# Patient Record
Sex: Male | Born: 1970 | Race: White | Hispanic: No | Marital: Married | Smoking: Former smoker
Health system: Southern US, Community
[De-identification: ages and names within clinical notes are randomized; demographics above are authoritative.]

## PROBLEM LIST (undated history)

## (undated) DIAGNOSIS — E785 Hyperlipidemia, unspecified: Secondary | ICD-10-CM

## (undated) DIAGNOSIS — F419 Anxiety disorder, unspecified: Secondary | ICD-10-CM

## (undated) HISTORY — DX: Anxiety disorder, unspecified: F41.9

## (undated) HISTORY — PX: OTHER SURGICAL HISTORY: SHX169

## (undated) HISTORY — DX: Hyperlipidemia, unspecified: E78.5

## (undated) HISTORY — PX: APPENDECTOMY: SHX54

---

## 2000-02-17 ENCOUNTER — Ambulatory Visit (HOSPITAL_COMMUNITY): Admission: RE | Admit: 2000-02-17 | Discharge: 2000-02-17 | Payer: Self-pay | Admitting: Urology

## 2003-11-12 ENCOUNTER — Encounter: Admission: RE | Admit: 2003-11-12 | Discharge: 2003-11-12 | Payer: Self-pay | Admitting: Family Medicine

## 2009-02-04 ENCOUNTER — Observation Stay (HOSPITAL_COMMUNITY): Admission: EM | Admit: 2009-02-04 | Discharge: 2009-02-05 | Payer: Self-pay | Admitting: Emergency Medicine

## 2009-02-05 ENCOUNTER — Encounter (INDEPENDENT_AMBULATORY_CARE_PROVIDER_SITE_OTHER): Payer: Self-pay | Admitting: Surgery

## 2009-08-27 ENCOUNTER — Observation Stay (HOSPITAL_COMMUNITY): Admission: EM | Admit: 2009-08-27 | Discharge: 2009-08-28 | Payer: Self-pay | Admitting: Emergency Medicine

## 2010-12-22 LAB — COMPREHENSIVE METABOLIC PANEL
ALT: 58 U/L — ABNORMAL HIGH (ref 0–53)
AST: 26 U/L (ref 0–37)
Albumin: 4.3 g/dL (ref 3.5–5.2)
Alkaline Phosphatase: 72 U/L (ref 39–117)
BUN: 13 mg/dL (ref 6–23)
CO2: 24 mEq/L (ref 19–32)
Calcium: 8.6 mg/dL (ref 8.4–10.5)
Chloride: 104 mEq/L (ref 96–112)
GFR calc Af Amer: >60 mL/min (ref >60)
GFR calc non Af Amer: >60 mL/min (ref >60)
Potassium: 3.6 mEq/L (ref 3.5–5.1)
Sodium: 142 mEq/L (ref 135–145)
Total Bilirubin: 0.9 mg/dL (ref 0.3–1.2)
Total Protein: 6.1 g/dL (ref 6.0–8.3)

## 2010-12-22 LAB — DIFFERENTIAL
Basophils Absolute: 0 10*3/uL (ref 0.0–0.1)
Basophils Relative: 0 % (ref 0–1)
Eosinophils Absolute: 0.1 10*3/uL (ref 0.0–0.7)
Eosinophils Relative: 0 % (ref 0–5)
Lymphs Abs: 1.7 10*3/uL (ref 0.7–4.0)
Monocytes Absolute: 0.7 10*3/uL (ref 0.1–1.0)
Monocytes Relative: 11 % (ref 3–12)
Neutro Abs: 11.6 10*3/uL — ABNORMAL HIGH (ref 1.7–7.7)
Neutrophils Relative %: 71 % (ref 43–77)

## 2010-12-22 LAB — BASIC METABOLIC PANEL
CO2: 26 mEq/L (ref 19–32)
Calcium: 8.9 mg/dL (ref 8.4–10.5)
Glucose, Bld: 115 mg/dL — ABNORMAL HIGH (ref 70–99)
Sodium: 140 mEq/L (ref 135–145)

## 2010-12-22 LAB — CBC
HCT: 39.2 % (ref 39.0–52.0)
HCT: 44.2 % (ref 39.0–52.0)
Hemoglobin: 13.9 g/dL (ref 13.0–17.0)
Hemoglobin: 15.7 g/dL (ref 13.0–17.0)
MCHC: 35.3 g/dL (ref 30.0–36.0)
MCHC: 35.5 g/dL (ref 30.0–36.0)
MCV: 88.5 fL (ref 78.0–100.0)
Platelets: 196 10*3/uL (ref 150–400)
Platelets: 226 10*3/uL (ref 150–400)
RBC: 5.02 MIL/uL (ref 4.22–5.81)
RDW: 12.4 % (ref 11.5–15.5)
RDW: 12.6 % (ref 11.5–15.5)
WBC: 13.3 10*3/uL — ABNORMAL HIGH (ref 4.0–10.5)

## 2010-12-22 LAB — URINALYSIS, ROUTINE W REFLEX MICROSCOPIC
Bilirubin Urine: NEGATIVE
Hgb urine dipstick: NEGATIVE
Protein, ur: NEGATIVE mg/dL
Urobilinogen, UA: 1 mg/dL (ref 0.0–1.0)

## 2010-12-22 LAB — HEPATITIS PANEL, ACUTE
HCV Ab: NEGATIVE
Hepatitis B Surface Ag: NEGATIVE

## 2010-12-22 LAB — URINE CULTURE: Culture: NO GROWTH

## 2010-12-22 LAB — LIPASE, BLOOD: Lipase: 16 U/L (ref 11–59)

## 2010-12-29 LAB — URINALYSIS, ROUTINE W REFLEX MICROSCOPIC
Hgb urine dipstick: NEGATIVE
Ketones, ur: NEGATIVE mg/dL
Protein, ur: NEGATIVE mg/dL
Urobilinogen, UA: 0.2 mg/dL (ref 0.0–1.0)

## 2010-12-29 LAB — HEPATIC FUNCTION PANEL
ALT: 33 U/L (ref 0–53)
Albumin: 4.1 g/dL (ref 3.5–5.2)
Alkaline Phosphatase: 73 U/L (ref 39–117)
Indirect Bilirubin: 0.7 mg/dL (ref 0.3–0.9)
Total Bilirubin: 0.8 mg/dL (ref 0.3–1.2)
Total Protein: 7.1 g/dL (ref 6.0–8.3)

## 2010-12-29 LAB — BASIC METABOLIC PANEL
BUN: 10 mg/dL (ref 6–23)
CO2: 24 mEq/L (ref 19–32)
Calcium: 9.4 mg/dL (ref 8.4–10.5)
Creatinine, Ser: 0.93 mg/dL (ref 0.4–1.5)
Glucose, Bld: 106 mg/dL — ABNORMAL HIGH (ref 70–99)

## 2010-12-29 LAB — CBC
MCHC: 34.8 g/dL (ref 30.0–36.0)
MCV: 87.2 fL (ref 78.0–100.0)
RDW: 12.6 % (ref 11.5–15.5)

## 2010-12-29 LAB — DIFFERENTIAL
Basophils Absolute: 0 10*3/uL (ref 0.0–0.1)
Basophils Relative: 1 % (ref 0–1)
Eosinophils Absolute: 0.1 10*3/uL (ref 0.0–0.7)
Neutro Abs: 3.5 10*3/uL (ref 1.7–7.7)
Neutrophils Relative %: 62 % (ref 43–77)

## 2010-12-29 LAB — LIPASE, BLOOD: Lipase: 19 U/L (ref 11–59)

## 2011-02-02 NOTE — H&P (Signed)
NAME:  Joshua Ochoa, Joshua Ochoa NO.:  [id number]   MEDICAL RECORD NO.:  [id number]          PATIENT TYPE:  INP   LOCATION:  3036                         FACILITY:  [facility]   PHYSICIAN:  Clovis Pu. Cornett, M.D.DATE OF BIRTH:  02-03-1971   DATE OF ADMISSION:  02/04/2009  DATE OF DISCHARGE:  02/05/2009                              HISTORY & PHYSICAL   CHIEF COMPLAINT:  Right lower quadrant pain.   HISTORY OF PRESENT ILLNESS:  The patient is a 40 year old male that I  was asked to see tonight due to a 36-hour history of abdominal pain.  The pain is located initially in his periumbilical region, now in his  right lower quadrant.  The pain is constant in nature, it is 5-6/10 and  slowly getting worse.  Pain is associated with temperature of 99.  No  nausea, vomiting, diarrhea, or blood in the stool noted at this point in  time.  The patient underwent a CT scan of his abdomen and pelvis, which  showed acute without appendicitis without perforation.  I was asked to  see him at the request of Dr. Gray Bernhardt in the emergency room for the  above.  The pain has been worse with palpation of his abdomen.  His pain  was better at rest.   PAST MEDICAL HISTORY:  Anxiety and hyperlipidemia.   PAST SURGICAL HISTORY:  None.   FAMILY HISTORY:  Noncontributory.   SOCIAL HISTORY:  Denies tobacco and drinks alcohol occasionally.   ALLERGIES:  SHELLFISH.   MEDICATIONS:  1. Zoloft 100 mg daily.  2. Wellbutrin 300 mg daily.  3. Simvastatin 40 mg daily.   REVIEW OF SYSTEMS:  Negative x15, except for that as above.   PHYSICAL EXAMINATION:  VITAL SIGNS:  Temperature 97, pulse 72, blood  pressure 102/55, and respiratory rate is 18.  GENERAL APPEARANCE:  Pleasant male in no apparent distress.  HEENT:  Extraocular movements are intact.  No evidence of scleral  icterus.  NECK:  Supple and nontender.  Trachea midline without mass.  PULMONARY:  Lungs sounds are clear bilaterally.  CHEST:  Wall  motion is normal.  CARDIOVASCULAR:  Regular rate and rhythm without rub, murmur, or gallop.  EXTREMITIES:  Warm and well perfused.  ABDOMEN:  Tender in right lower quadrant with mild rebound and guarding  noted.  No hernia.  No ascites.  EXTREMITIES:  Range of motion is normal.  Muscle tone is normal.  NEUROLOGIC:  Glasgow coma scale is 15.  Motor and sensory function are  intact.   DIAGNOSTIC STUDIES:  Abdomen and pelvic CT shows early acute  appendicitis without perforation.  He has a white count of 5,700 without  left shift, liver function studies are normal.  Lipase is normal.  Hemoglobin 15, platelet count 205,000.  Sodium 138, potassium 3.8,  chloride 104, CO2 24, BUN 10, creatinine 0.9, and glucose 106.   IMPRESSION:  1. Acute appendicitis.  2. History of anxiety.  3. Hyperlipidemia.   PLAN:  Recommended laparoscopic appendectomy with both potential for  conversation to an open appendectomy.  Risk of  bleeding, infection,  organ injury are all discussed.  Alternative therapies of antibiotics  were also discussed but the potential for surgery after antibiotic  therapy still present and I think he is an excellent operative  candidate.  He understands the above and agrees to proceed with  laparoscopic appendectomy.      Thomas A. Cornett, M.D.  Electronically Signed     TAC/MEDQ  D:  02/05/2009  T:  02/05/2009  Job:  [id number]

## 2011-02-02 NOTE — Op Note (Signed)
NAME:  Joshua Ochoa, Joshua Ochoa NO.:  [id number]   MEDICAL RECORD NO.:  [id number]          PATIENT TYPE:  INP   LOCATION:  3036                         FACILITY:  [facility]   PHYSICIAN:  Clovis Pu. Cornett, M.D.DATE OF BIRTH:  11-Jan-1971   DATE OF PROCEDURE:  02/05/2009  DATE OF DISCHARGE:                               OPERATIVE REPORT   PREOPERATIVE DIAGNOSIS:  Acute appendicitis.   POSTOPERATIVE DIAGNOSIS:  Acute appendicitis.   PROCEDURE:  Laparoscopic appendectomy.   SURGEON:  Maisie Fus A. Cornett, MD   ANESTHESIA:  General endotracheal anesthesia with 0.25% Sensorcaine  local.   ESTIMATED BLOOD LOSS:  30 mL.   SPECIMEN:  Appendix to Pathology.   DRAINS:  None.   INDICATIONS FOR PROCEDURE:  The patient is a 40 year old male with a 1-  day history of abdominal pain.  CT scan showed acute appendicitis and he  was brought to the operating room for laparoscopic appendectomy for  acute appendicitis.   DESCRIPTION OF PROCEDURE:  The patient was brought to the OR after  informed consent.  We discussed with him both laparoscopic appendectomy,  open appendectomy, and alternative therapy of antibiotics and the pros  and cons of each.  He wished to undergo laparoscopic appendectomy.  After informed consent was obtained, he was taken back to the operating  room.  He was placed supine.  After induction of general anesthesia, the  abdomen was prepped and draped in a sterile fashion.  Foley catheter was  placed.  A 1-cm supraumbilical incision was made.  Dissection was  carried down to his fascia and his fascia was opened in the midline.  I  used a small Kelly clamp to open the peritoneal lining and enter the  abdominal cavity without difficulty under direct vision.  Purse-string  suture of 0-Vicryl was placed and a 12-mm Hasson cannula was placed  under direct vision.  Pneumoperitoneum was created to 15 mmHg of CO2 and  a laparoscope was placed.  The patient was placed in  Trendelenburg and  rolled towards left.  I placed two 5-mm ports under direct vision.  One  in the midline just below the umbilicus, the second one in left lower  quadrant.  Cecum was identified, the appendix was long and tortuous, it  was somewhat retrocecal.  It was inflamed.  Harmonic scalpel was used to  dissect the appendix from the mesoappendix, good hemostasis.  Once I got  to the base of the appendix, I used a 5-mm scope and placed a GIA 45  stapler device across the base of the appendix at the level cecum and  fired it without difficulty.  We then put the appendix in an EndoCatch  bag and extracted it.  I then replaced a 10-mm scope, examined the  appendiceal stump.  There was some oozing that I controlled with  Surgicel.  The area where the appendix was dissected off the posterior  cecum was hemostatic, and there was no evidence of injury to the ileum  or cecum.  Remainder of the small bowel was normal without signs of  injury.  Stomach looked normal as well as the liver with some mild fatty  infiltration, but no cirrhosis.  Excess irrigation was suctioned out.  Hemostasis was examined and found to be adequate.  At this point in  time, we removed the ports allowed the CO2 to escape.  The umbilical  fascia was closed with purse-string suture of 0-Vicryl  and 4-0 Monocryl was used to close skin.  Dermabond was applied.  All  final counts of sponge, needle, and instruments were found to be correct  at this portion of case.  The patient was awoken, taken to recovery in  satisfactory condition.      Thomas A. Cornett, M.D.  Electronically Signed     TAC/MEDQ  D:  02/05/2009  T:  02/05/2009  Job:  [id number]

## 2011-02-05 NOTE — Op Note (Signed)
[facility]  Patient:    Joshua Ochoa, Joshua Ochoa                         MRN: [id number] Adm. Date:  [id number] Attending:  Thermon Leyland CC:         [name], M.D.                           Operative Report  PREOPERATIVE DIAGNOSES: 1. Large left varicocele. 2. Infertility.  POSTOPERATIVE DIAGNOSES: 1. Large left varicocele. 2. Infertility.  PROCEDURES PERFORMED:  Left varicocele ligation.  SURGEON:  Barron Alvine, M.D.  ASSISTANT:  ANESTHESIA:  General.  INDICATIONS:  Mr. Joshua Ochoa is a 40 year old male.  He and his wife have been experiencing infertility for approximately one year now.  He underwent evaluation in our office, which revealed abnormal semen analysis.  He had borderline sperm concentration of 18,000,000 sperm with a total count of approximately 47,000,000.  Motility was reasonably normal, but morphology was significantly abnormal.  A repeat semen analysis showed a significantly lower count.  On clinical exam, the patient had a very large left varicocele.  We discussed at length some of the issues with regard to varicocele and infertility.  We discussed the option of varicocele ligation.  We talked about the potential risks and benefits of this approach and he elected to proceed with this.  DESCRIPTION OF PROCEDURE:  The patient was brought to the operating room where he had successful induction of general anesthesia.  He was prepped and draped in normal manner in the supine position.  Magnifying operative loops were utilized.  A standard left inguinal incision was performed and carried down to the level of the external oblique fascia.  The external ring was identified and the external oblique fascia was opened in the direction of its fibers to expose the underlying spermatic cord.  One could appreciate markedly enlarged spermatic cord veins.  At the level of the pubic tubercle, the entire spermatic cord was encircled with a  Penrose drain.  Coming out of the floor of the inguinal canal was a very large cremasteric vein.  This was approximately 8 mm in diameter.  This vein was ligated with 2-0 Vicryl suture and clipped as well.  Attention was then turned towards dissection of the spermatic cord.  In addition to the operative loops, an intraoperative Doppler was also utilized. We encountered three very large spermatic cord veins.  These measured between 5-8 mm in size.  These were all suture ligated and clipped.  We identified the spermatic artery, which was preserved and also the vasal artery.  I made no attempt to dissect some very small veins around the vas.  At the completion of the procedure, we had ligated all significant veins.  There were some very small veins around the vas, but we left those intact with the adventitia all along the vas.  Once again, utilizing a Doppler, we confirmed the presence of a spermatic cord artery.  There was really no other significant tissue within the spermatic cord at that juncture.  The spermatic cord was placed back in position.  A Marcaine block was utilized.  The external oblique fascia was fairly attenuated.  This was reapproximated with 2-0 Vicryl suture.  I did not appreciate any obvious hernia through the floor.  The ilioinguinal nerve was identified and preserved.  The wound was irrigated.  The subcutaneous tissues were reapproximated  with 3-0 Vicryl and a 4-0 Vicryl was used for subcuticular.  The patient appeared to tolerate the procedure well and there were no obvious complications. DD:  02/17/00 TD:  02/18/00 Job: [id number]

## 2011-12-30 ENCOUNTER — Ambulatory Visit
Admission: RE | Admit: 2011-12-30 | Discharge: 2011-12-30 | Disposition: A | Discharge status: Home / Self Care | Payer: 59 | Source: Ambulatory Visit | Attending: Internal Medicine | Admitting: Internal Medicine

## 2011-12-30 ENCOUNTER — Other Ambulatory Visit: Payer: Self-pay | Admitting: Internal Medicine

## 2011-12-30 DIAGNOSIS — R410 Disorientation, unspecified: Secondary | ICD-10-CM

## 2011-12-30 DIAGNOSIS — R51 Headache: Secondary | ICD-10-CM

## 2011-12-30 DIAGNOSIS — R42 Dizziness and giddiness: Secondary | ICD-10-CM

## 2012-06-09 IMAGING — CT CT HEAD W/O CM
2 series · 16 of 30 positions shown, 18 images · non-contrast
Comparison: None.

CLINICAL DATA: Left frontotemporal headache for 1 month, confusion
with some dizziness

CT HEAD WITHOUT CONTRAST
TECHNIQUE: Contiguous axial images were obtained from the base of
the skull through the vertex without contrast.

[Series 2: head w/o · axial · non-contrast · 0.45mm/px · z∈[+36,+146]mm · 8 of 28 slices shown, 10 images]
[im 4/28  brain]
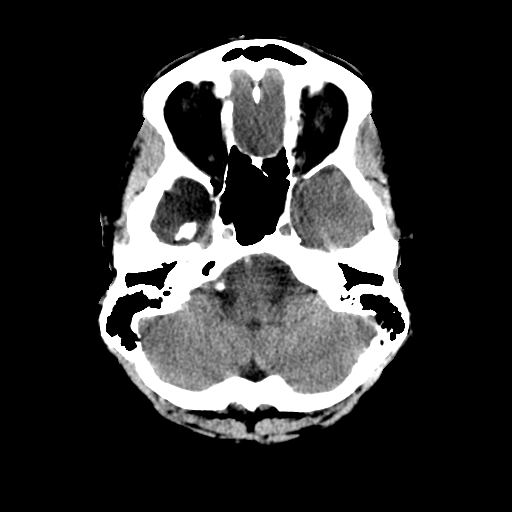
[im 4/28  bone]
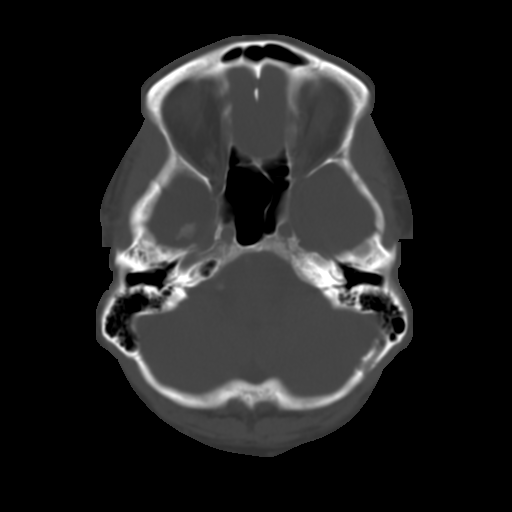
[im 7/28  brain]
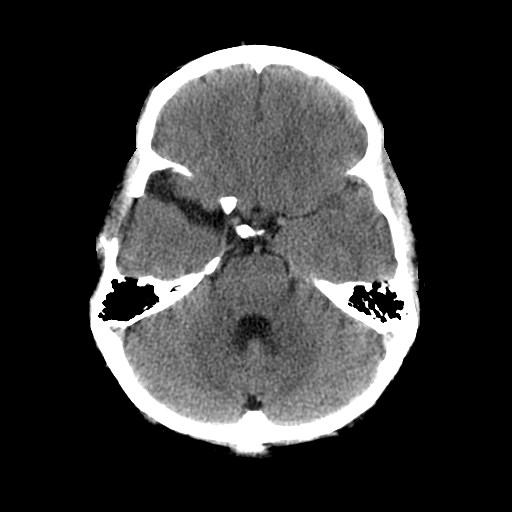
[im 10/28  brain]
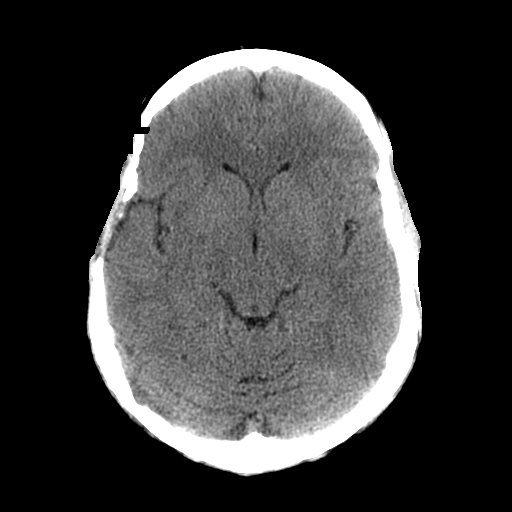
[im 13/28  brain]
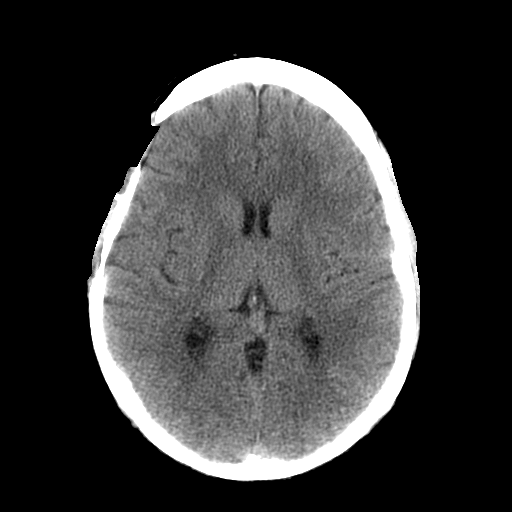
[im 16/28  brain]
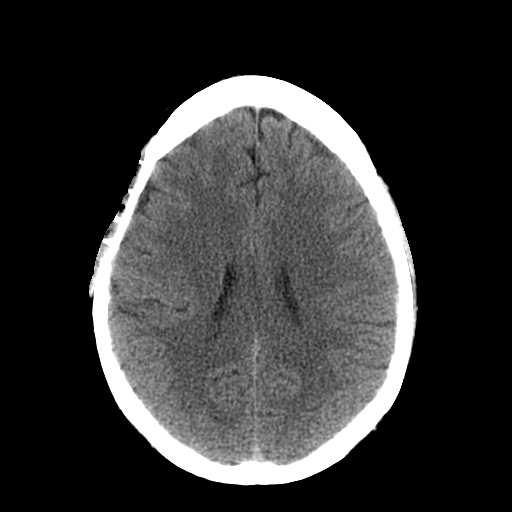
[im 16/28  bone]
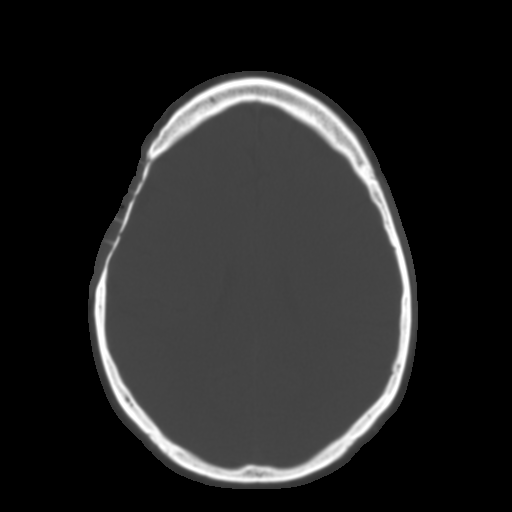
[im 19/28  brain]
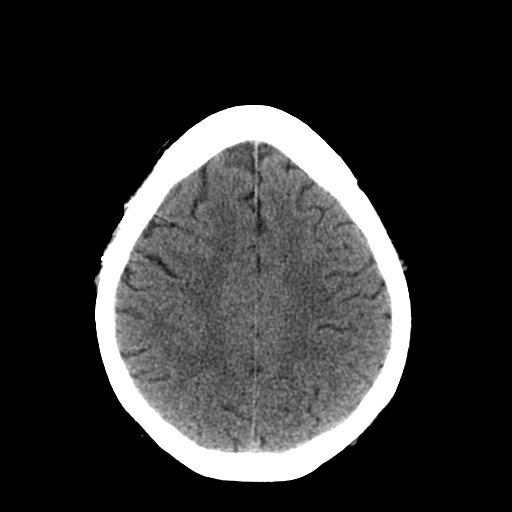
[im 22/28  brain]
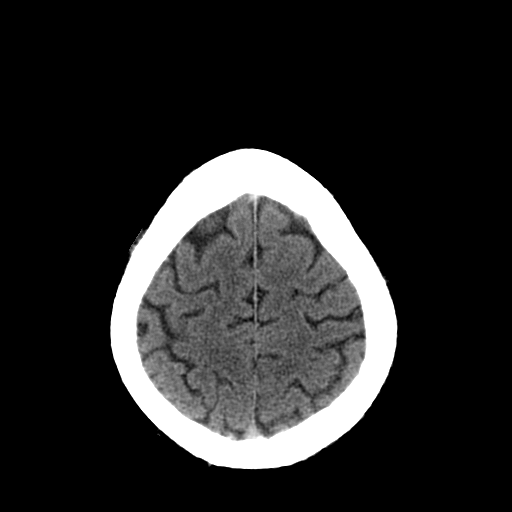
[im 25/28  brain]
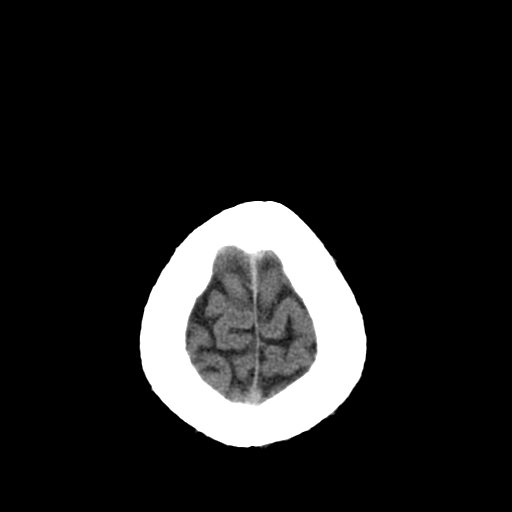

[Series 3: head bone · axial · 0.45mm/px · z∈[+32,+148]mm · 8 of 56 slices shown]
[im 6/56  bone]
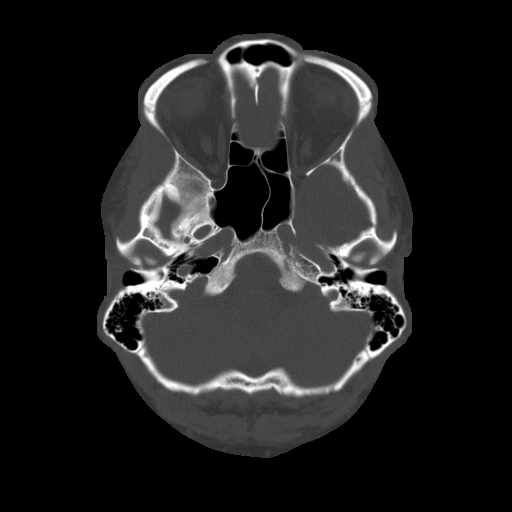
[im 12/56  bone]
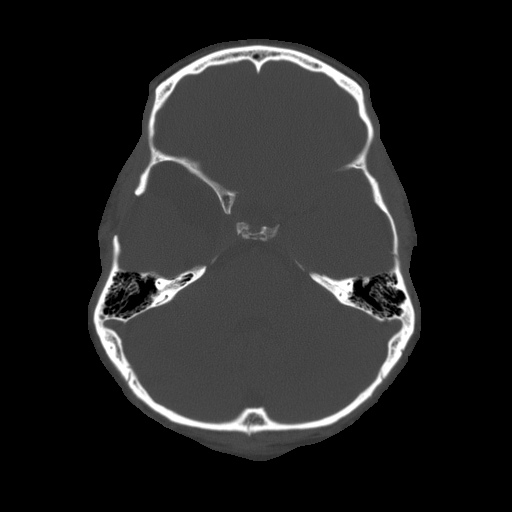
[im 18/56  bone]
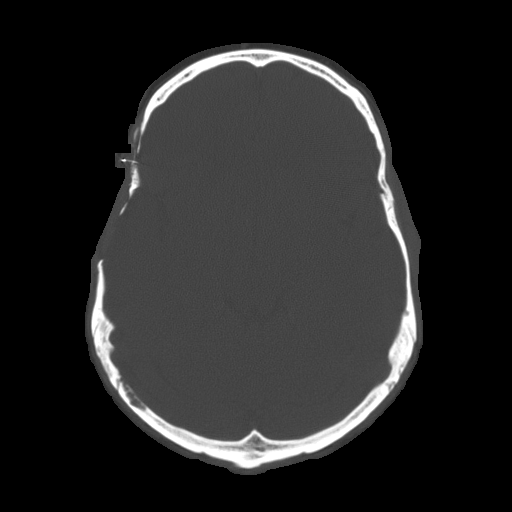
[im 24/56  bone]
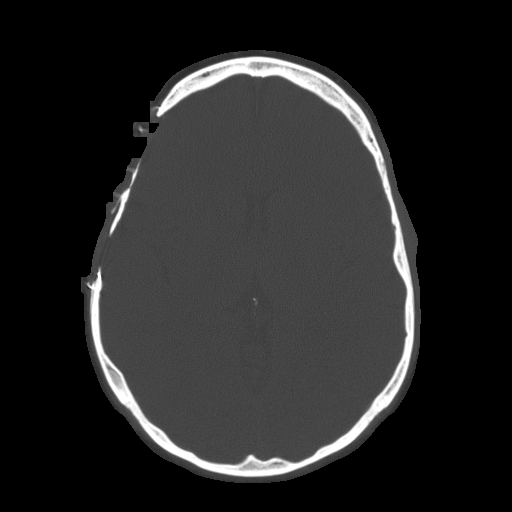
[im 32/56  bone]
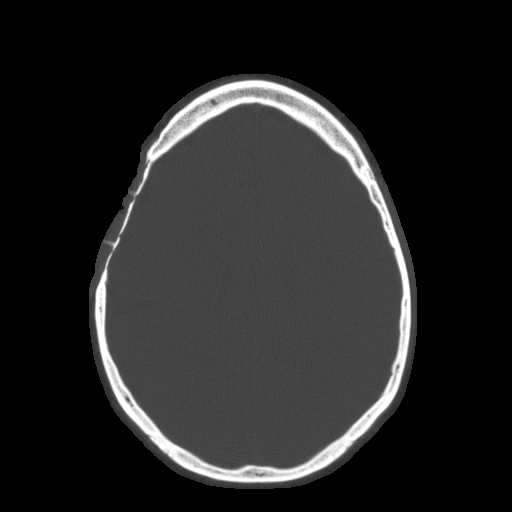
[im 38/56  bone]
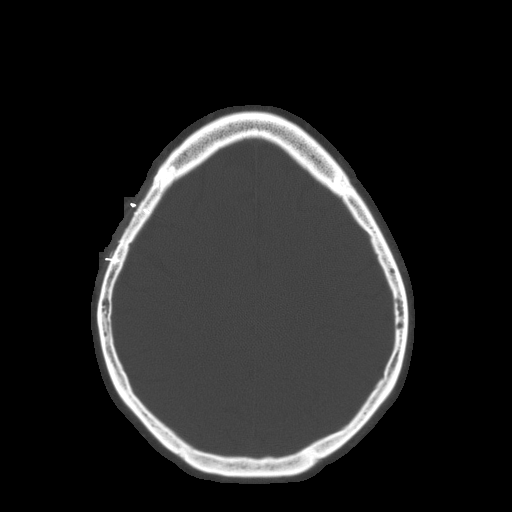
[im 44/56  bone]
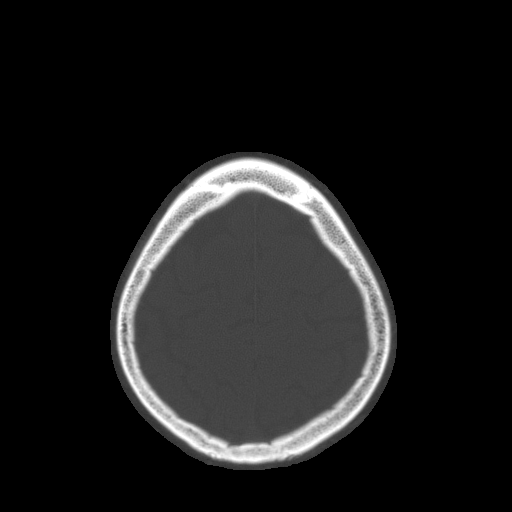
[im 50/56  bone]
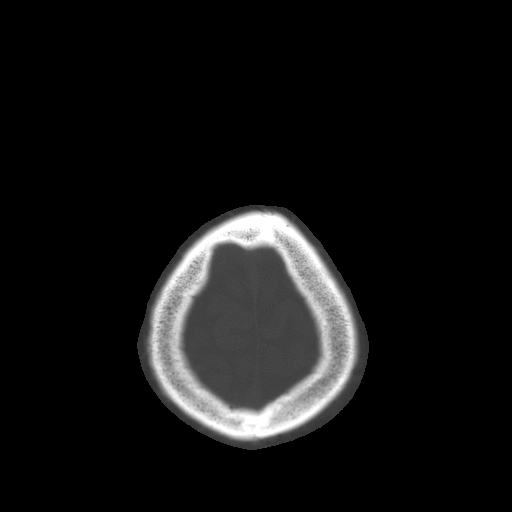

[16 of 30 positions shown; findings below may reference images not displayed]

FINDINGS: Encephalomalacia is noted in the right temporal region
with changes of prior right temporal parietal cranioplasty noted.
There is some air in the soft tissues over the right temporal
parietal cranioplasty site.  This probably is air trapped within
the cranioplasty itself and is of doubtful significance.  No
hemorrhage, mass lesion, or acute infarction is seen.  The
ventricular system is normal in size and the septum is midline in
position.  No acute calvarial abnormality is seen.
IMPRESSION: Right temporal encephalomalacia.  No acute intracranial
abnormality.

## 2014-11-07 ENCOUNTER — Ambulatory Visit (INDEPENDENT_AMBULATORY_CARE_PROVIDER_SITE_OTHER): Payer: 59

## 2014-11-07 ENCOUNTER — Ambulatory Visit: Payer: Self-pay | Admitting: Podiatry

## 2014-11-07 ENCOUNTER — Ambulatory Visit (INDEPENDENT_AMBULATORY_CARE_PROVIDER_SITE_OTHER): Payer: 59 | Admitting: Podiatrist

## 2014-11-07 ENCOUNTER — Encounter: Payer: Self-pay | Admitting: Podiatrist

## 2014-11-07 VITALS — BP 123/72 | HR 89 | Resp 10 | Ht 73.0 in | Wt 218.0 lb

## 2014-11-07 DIAGNOSIS — M722 Plantar fascial fibromatosis: Secondary | ICD-10-CM

## 2014-11-07 NOTE — Progress Notes (Signed)
   Subjective:    Patient ID: Joshua Ochoa, male    DOB: [birth year], 44 y.o.   MRN: [id number]  HPI Chief Complaint  Patient presents with  . Plantar Fasciitis    "I've got pain in the front part of my right heel."  right inferior arch Pt complains of right inferior arch pain for 1 month, worsening 1 week.  Pt is currently wearing elastic arch bands with arch supports on both feet.       Review of Systems  All other systems reviewed and are negative.      Objective:   Physical Exam  GENERAL APPEARANCE: Alert, conversant. Appropriately groomed. No acute distress.  VASCULAR: Pedal pulses palpable and strong bilateral.  Capillary refill time is immediate to all digits,  Proximal to distal cooling it warm to warm.  Digital hair growth is present bilateral  NEUROLOGIC: sensation is intact epicritically and protectively to 5.07 monofilament at 5/5 sites bilateral.  Light touch is intact bilateral, vibratory sensation intact bilateral, achilles tendon reflex is intact bilateral.  Negative Tinel sign is elicited MUSCULOSKELETAL: Pain on palpation plantar medial aspect right foot  at insertion of plantar fascia on the medial calcaneal tubercle. Inflammation at the insertion of the plantar fascia is present. Rectus foot type is seen. DERMATOLOGIC: skin color, texture, and turger are within normal limits.  No preulcerative lesions are seen, no interdigital maceration noted.  No open lesions present.  Digital nails are asymptomatic.      Assessment & Plan:  Plantar fasciitis right  Plan:  Injected the heel with kenalog and marcaine plain without complication.  He was scanned for orthotics and given stretching exercises.  Will see him back for dispensing of orthotics.

## 2014-11-07 NOTE — Patient Instructions (Signed)

## 2014-11-29 ENCOUNTER — Ambulatory Visit: Payer: 59 | Admitting: *Deleted

## 2014-11-29 DIAGNOSIS — M722 Plantar fascial fibromatosis: Secondary | ICD-10-CM

## 2014-11-29 NOTE — Progress Notes (Signed)
Patient ID: Joshua Ochoa, male   DOB: 1970/10/21, 44 y.o.   MRN: [id number] PICKING UP INSERTS

## 2014-11-29 NOTE — Patient Instructions (Signed)

## 2016-11-15 ENCOUNTER — Other Ambulatory Visit (HOSPITAL_BASED_OUTPATIENT_CLINIC_OR_DEPARTMENT_OTHER): Payer: Self-pay

## 2016-11-15 DIAGNOSIS — G4730 Sleep apnea, unspecified: Secondary | ICD-10-CM

## 2016-11-15 DIAGNOSIS — R0683 Snoring: Secondary | ICD-10-CM

## 2016-12-01 ENCOUNTER — Ambulatory Visit (HOSPITAL_BASED_OUTPATIENT_CLINIC_OR_DEPARTMENT_OTHER): Payer: BLUE CROSS/BLUE SHIELD | Attending: Otolaryngology | Admitting: Internal Medicine

## 2016-12-01 DIAGNOSIS — G4736 Sleep related hypoventilation in conditions classified elsewhere: Secondary | ICD-10-CM | POA: Diagnosis not present

## 2016-12-01 DIAGNOSIS — G4733 Obstructive sleep apnea (adult) (pediatric): Secondary | ICD-10-CM | POA: Diagnosis not present

## 2016-12-08 ENCOUNTER — Other Ambulatory Visit (HOSPITAL_BASED_OUTPATIENT_CLINIC_OR_DEPARTMENT_OTHER): Payer: Self-pay

## 2016-12-08 DIAGNOSIS — R0683 Snoring: Secondary | ICD-10-CM

## 2016-12-08 DIAGNOSIS — G4730 Sleep apnea, unspecified: Secondary | ICD-10-CM

## 2016-12-11 DIAGNOSIS — G4733 Obstructive sleep apnea (adult) (pediatric): Secondary | ICD-10-CM | POA: Diagnosis not present

## 2016-12-11 NOTE — Procedures (Signed)
   Patient Name: Joshua Ochoa, Joshua Ochoa Date: 12/02/2016 Gender: Male D.O.B: [birth year] Age (years): 45 Referring Provider: Jodi Marble Height (inches): 63 Interpreting Physician: Baird Lyons MD, ABSM Weight (lbs): 205 RPSGT: Jacolyn Reedy BMI: 29 MRN: [id number] Neck Size: 16.50 CLINICAL INFORMATION Sleep Study Type: unattended HST     Indication for sleep study: Snoring     Epworth Sleepiness Score: 14  SLEEP STUDY TECHNIQUE A multi-channel overnight portable sleep study was performed. The channels recorded were: nasal airflow, thoracic respiratory movement, and oxygen saturation with a pulse oximetry. Snoring was also monitored.  MEDICATIONS Patient self administered medications include: none reported.  SLEEP ARCHITECTURE Patient was studied for 342.0 minutes. The sleep efficiency was 99.1 % and the patient was supine for 94%. The arousal index was 0.0 per hour.  RESPIRATORY PARAMETERS The overall AHI was 67.0 per hour, with a central apnea index of 0.5 per hour.  The oxygen nadir was 70% during sleep.  CARDIAC DATA Mean heart rate during sleep was 68.4 bpm.  IMPRESSIONS - Severe obstructive sleep apnea occurred during this study (AHI = 67.0/h). - No significant central sleep apnea occurred during this study (CAI = 0.5/h). - Severe oxygen desaturation was noted during this study (Min O2 = 70%). - Patient snored   DIAGNOSIS - Obstructive Sleep Apnea (327.23 [G47.33 ICD-10]) - Nocturnal Hypoxemia (327.26 [G47.36 ICD-10])  RECOMMENDATIONS - Recommend CPAP titration. Clinical judgment may indicate alternatives. - Positional therapy avoiding supine position during sleep. - Avoid alcohol, sedatives and other CNS depressants that may worsen sleep apnea and disrupt normal sleep architecture. - Sleep hygiene should be reviewed to assess factors that may improve sleep quality. - Weight management and regular exercise should be initiated or  continued.  [Electronically signed] 12/11/2016 10:45 AM  Baird Lyons MD, ABSM Diplomate, American Board of Sleep Medicine   NPI: [id number] [place], American Board of Sleep Medicine  ELECTRONICALLY SIGNED ON:  12/11/2016, 10:43 AM [place] PH: [phone]   FX: [phone] [place]

## 2017-03-23 DIAGNOSIS — G4733 Obstructive sleep apnea (adult) (pediatric): Secondary | ICD-10-CM | POA: Diagnosis not present

## 2017-03-30 DIAGNOSIS — Z86010 Personal history of colonic polyps: Secondary | ICD-10-CM | POA: Diagnosis not present

## 2017-03-30 DIAGNOSIS — D126 Benign neoplasm of colon, unspecified: Secondary | ICD-10-CM | POA: Diagnosis not present

## 2017-03-30 DIAGNOSIS — Z800 Family history of malignant neoplasm of digestive organs: Secondary | ICD-10-CM | POA: Diagnosis not present

## 2017-03-31 DIAGNOSIS — D126 Benign neoplasm of colon, unspecified: Secondary | ICD-10-CM | POA: Diagnosis not present

## 2017-04-23 DIAGNOSIS — G4733 Obstructive sleep apnea (adult) (pediatric): Secondary | ICD-10-CM | POA: Diagnosis not present

## 2017-04-29 DIAGNOSIS — G4733 Obstructive sleep apnea (adult) (pediatric): Secondary | ICD-10-CM | POA: Diagnosis not present

## 2017-05-05 DIAGNOSIS — R350 Frequency of micturition: Secondary | ICD-10-CM | POA: Diagnosis not present

## 2017-05-05 DIAGNOSIS — N3020 Other chronic cystitis without hematuria: Secondary | ICD-10-CM | POA: Diagnosis not present

## 2017-05-24 DIAGNOSIS — G4733 Obstructive sleep apnea (adult) (pediatric): Secondary | ICD-10-CM | POA: Diagnosis not present

## 2017-06-03 DIAGNOSIS — N3010 Interstitial cystitis (chronic) without hematuria: Secondary | ICD-10-CM | POA: Diagnosis not present

## 2017-06-23 DIAGNOSIS — N411 Chronic prostatitis: Secondary | ICD-10-CM | POA: Diagnosis not present

## 2017-06-23 DIAGNOSIS — G4733 Obstructive sleep apnea (adult) (pediatric): Secondary | ICD-10-CM | POA: Diagnosis not present

## 2017-06-23 DIAGNOSIS — N3020 Other chronic cystitis without hematuria: Secondary | ICD-10-CM | POA: Diagnosis not present

## 2017-07-24 DIAGNOSIS — G4733 Obstructive sleep apnea (adult) (pediatric): Secondary | ICD-10-CM | POA: Diagnosis not present

## 2017-08-05 DIAGNOSIS — G4733 Obstructive sleep apnea (adult) (pediatric): Secondary | ICD-10-CM | POA: Diagnosis not present

## 2017-08-23 DIAGNOSIS — G4733 Obstructive sleep apnea (adult) (pediatric): Secondary | ICD-10-CM | POA: Diagnosis not present

## 2017-08-31 DIAGNOSIS — E782 Mixed hyperlipidemia: Secondary | ICD-10-CM | POA: Diagnosis not present

## 2017-08-31 DIAGNOSIS — R7309 Other abnormal glucose: Secondary | ICD-10-CM | POA: Diagnosis not present

## 2017-08-31 DIAGNOSIS — Z0000 Encounter for general adult medical examination without abnormal findings: Secondary | ICD-10-CM | POA: Diagnosis not present

## 2017-08-31 DIAGNOSIS — Z131 Encounter for screening for diabetes mellitus: Secondary | ICD-10-CM | POA: Diagnosis not present

## 2017-09-06 DIAGNOSIS — N401 Enlarged prostate with lower urinary tract symptoms: Secondary | ICD-10-CM | POA: Diagnosis not present

## 2017-09-06 DIAGNOSIS — E782 Mixed hyperlipidemia: Secondary | ICD-10-CM | POA: Diagnosis not present

## 2017-09-06 DIAGNOSIS — Z0000 Encounter for general adult medical examination without abnormal findings: Secondary | ICD-10-CM | POA: Diagnosis not present

## 2017-09-23 DIAGNOSIS — G4733 Obstructive sleep apnea (adult) (pediatric): Secondary | ICD-10-CM | POA: Diagnosis not present

## 2017-10-24 DIAGNOSIS — G4733 Obstructive sleep apnea (adult) (pediatric): Secondary | ICD-10-CM | POA: Diagnosis not present

## 2017-11-21 DIAGNOSIS — G4733 Obstructive sleep apnea (adult) (pediatric): Secondary | ICD-10-CM | POA: Diagnosis not present

## 2017-12-24 ENCOUNTER — Encounter (HOSPITAL_COMMUNITY): Payer: Self-pay | Admitting: Emergency Medicine

## 2017-12-24 ENCOUNTER — Emergency Department (HOSPITAL_COMMUNITY)
Admission: EM | Admit: 2017-12-24 | Discharge: 2017-12-24 | Disposition: A | Discharge status: Home / Self Care | Payer: 59 | Attending: Emergency Medicine | Admitting: Emergency Medicine

## 2017-12-24 ENCOUNTER — Other Ambulatory Visit: Payer: Self-pay

## 2017-12-24 ENCOUNTER — Emergency Department (HOSPITAL_COMMUNITY): Payer: 59

## 2017-12-24 DIAGNOSIS — F419 Anxiety disorder, unspecified: Secondary | ICD-10-CM | POA: Diagnosis not present

## 2017-12-24 DIAGNOSIS — S61411A Laceration without foreign body of right hand, initial encounter: Secondary | ICD-10-CM | POA: Diagnosis not present

## 2017-12-24 DIAGNOSIS — Z23 Encounter for immunization: Secondary | ICD-10-CM | POA: Insufficient documentation

## 2017-12-24 DIAGNOSIS — Y998 Other external cause status: Secondary | ICD-10-CM | POA: Diagnosis not present

## 2017-12-24 DIAGNOSIS — Z87891 Personal history of nicotine dependence: Secondary | ICD-10-CM | POA: Diagnosis not present

## 2017-12-24 DIAGNOSIS — Y9389 Activity, other specified: Secondary | ICD-10-CM | POA: Diagnosis not present

## 2017-12-24 DIAGNOSIS — Y929 Unspecified place or not applicable: Secondary | ICD-10-CM | POA: Insufficient documentation

## 2017-12-24 DIAGNOSIS — W270XXA Contact with workbench tool, initial encounter: Secondary | ICD-10-CM | POA: Diagnosis not present

## 2017-12-24 DIAGNOSIS — Z79899 Other long term (current) drug therapy: Secondary | ICD-10-CM | POA: Diagnosis not present

## 2017-12-24 MED ORDER — LIDOCAINE-EPINEPHRINE (PF) 2 %-1:200000 IJ SOLN
10.0000 mL | Freq: Once | INTRAMUSCULAR | Status: AC
  Administered 2017-12-24: 10 mL
  Filled 2017-12-24: qty 20

## 2017-12-24 MED ORDER — TETANUS-DIPHTH-ACELL PERTUSSIS 5-2.5-18.5 LF-MCG/0.5 IM SUSP
0.5000 mL | Freq: Once | INTRAMUSCULAR | Status: AC
  Administered 2017-12-24: 0.5 mL via INTRAMUSCULAR
  Filled 2017-12-24: qty 0.5

## 2017-12-24 NOTE — ED Triage Notes (Signed)
Pt stated that he struck the top of his hand on the teeth of a chop saw. 5 inch open  laceration noted on posterior side of r/hand. Bleeding controlled. Pt denies LOC

## 2017-12-24 NOTE — Discharge Instructions (Signed)
Keep wound clean with mild soap and water. Keep area covered with a topical antibiotic ointment and bandage, keep bandage dry, and do not submerge in water for 24 hours. Keep splint on while the stitches are in place, in order to avoid tearing the wound open. Ice and elevate for additional pain and swelling relief. Alternate between Ibuprofen and Tylenol for additional pain relief. Follow up with your primary care doctor or the [facility] [place] in approximately 2-3 days for wound recheck and again in 7-10 days for wound recheck and suture removal. Monitor area for signs of infection to include, but not limited to: increasing pain, spreading redness, drainage/pus, worsening swelling, or fevers. Return to emergency department for emergent changing or worsening symptoms.

## 2017-12-24 NOTE — ED Provider Notes (Signed)
[facility] DEPT Provider Note   CSN: [id number] Arrival date & time: 12/24/17  1433     History   Chief Complaint Chief Complaint  Patient presents with  . Extremity Laceration    HPI Joshua Ochoa is a 47 y.o. L-handed male with a PMHx of anxiety, OSA on CPAP, and HLD, who presents to the ED with complaints of right hand laceration sustained at 2 PM, approximately 1 hour prior to arrival.  Patient was using a chop saw and brought his hand up and accidentally cut it on the blade of the saw.  Bleeding was quickly controlled with pressure and butterfly strips.  He washed the wound and applied the butterfly strips prior to arrival.  No known aggravating factors.  He denies any pain, swelling, bruising, numbness, tingling, focal weakness, loss of ROM of the fingers, or any other complaints or injuries at this time.  He is unsure of his last tetanus shot.  He is not on any blood thinners.  The history is provided by the patient and medical records. No language interpreter was used.  Laceration   The incident occurred 1 to 2 hours ago. The laceration is located on the right hand. The laceration is 7 cm in size. The laceration mechanism was a a metal edge. The pain is at a severity of 0/10. The patient is experiencing no pain. He reports no foreign bodies present. His tetanus status is unknown.    Past Medical History:  Diagnosis Date  . Anxiety   . Hyperlipidemia     There are no active problems to display for this patient.   Past Surgical History:  Procedure Laterality Date  . APPENDECTOMY    . head surgery          Home Medications    Prior to Admission medications   Medication Sig Start Date End Date Taking? Authorizing Provider  atorvastatin (LIPITOR) 40 MG tablet Take 40 mg by mouth daily.    [provider]  BuPROPion HCl (WELLBUTRIN PO) Take 450 mg by mouth.    [provider]  Cholecalciferol (VITAMIN D-3 PO) Take by  mouth.    [provider]  escitalopram (LEXAPRO) 20 MG tablet Take 20 mg by mouth daily.    [provider]    Family History Family History  Problem Relation Age of Onset  . Cancer Mother   . Heart failure Father     Social History Social History   Tobacco Use  . Smoking status: Former Smoker  Substance Use Topics  . Alcohol use: Yes    Alcohol/week: 0.0 oz    Comment: weekly  . Drug use: Never     Allergies   Shellfish allergy   Review of Systems Review of Systems  Musculoskeletal: Negative for arthralgias, joint swelling and myalgias.  Skin: Positive for wound. Negative for color change.  Allergic/Immunologic: Negative for immunocompromised state.  Neurological: Negative for weakness and numbness.  Hematological: Does not bruise/bleed easily.  Psychiatric/Behavioral: Negative for confusion.     Physical Exam Updated Vital Signs BP (!) 147/92 (BP Location: Left Arm)   Pulse 90   Temp 98.4 F (36.9 C) (Oral)   Resp 18   Wt 99.8 kg (220 lb)   SpO2 98%   BMI 29.03 kg/m    Physical Exam  Constitutional: He is oriented to person, place, and time. Vital signs are normal. He appears well-developed and well-nourished.  Non-toxic appearance. No distress.  Afebrile, nontoxic, NAD  HENT:  Head: Normocephalic and atraumatic.  Mouth/Throat: Mucous membranes are normal.  Eyes: Conjunctivae and EOM are normal. Right eye exhibits no discharge. Left eye exhibits no discharge.  Neck: Normal range of motion. Neck supple.  Cardiovascular: Normal rate and intact distal pulses.  Pulmonary/Chest: Effort normal. No respiratory distress.  Abdominal: Normal appearance. He exhibits no distension.  Musculoskeletal: Normal range of motion.       Right hand: He exhibits tenderness and laceration. He exhibits normal range of motion, normal two-point discrimination, normal capillary refill, no deformity and no swelling. Normal sensation noted. Normal strength  noted.  R hand with FROM intact in all joints of the fingers and hand, with an ~7cm fairly linear laceration to the dorsum of the R hand, crossing over the 3rd MCP joint, doesn't appear to disrupt the fascia or tendons, mild tenderness around the wound along the 2nd and 3rd MCP joint areas but otherwise no tenderness elsewhere. Bleeding controlled. No obvious retained FBs. No crepitus or deformity, no significant swelling. Strength and sensation grossly intact, distal pulses intact, cap refill brisk and present in all digits, and compartments soft. SEE PICTURE BELOW  Neurological: He is alert and oriented to person, place, and time. He has normal strength. No sensory deficit.  Skin: Skin is warm and dry. Laceration noted. No rash noted.  R hand lac as mentioned above and pictured below  Psychiatric: He has a normal mood and affect.  Nursing note and vitals reviewed.      ED Treatments / Results  Labs (all labs ordered are listed, but only abnormal results are displayed) Labs Reviewed - No data to display  EKG None  Radiology Dg Hand 2 View Right  Result Date: 12/24/2017 CLINICAL DATA:  Laceration. EXAM: RIGHT HAND - 2 VIEW COMPARISON:  None. FINDINGS: There is no evidence of fracture or dislocation. There is no evidence of arthropathy or other focal bone abnormality. No evidence for radiopaque soft tissue foreign body. IMPRESSION: Negative. Electronically Signed   By: Misty Stanley M.D.   On: 12/24/2017 16:20    Procedures .Marland KitchenLaceration Repair Date/Time: 12/24/2017 4:15 PM Performed by: Reece Agar, PA-C Authorized by: Reece Agar, [place]   Consent:    Consent obtained:  Verbal   Consent given by:  Patient   Risks discussed:  Infection, poor cosmetic result and poor wound healing   Alternatives discussed:  Referral Anesthesia (see MAR for exact dosages):    Anesthesia method:  Local infiltration   Local anesthetic:  Lidocaine 2% WITH epi Laceration details:    Location:   Hand   Hand location:  R hand, dorsum   Length (cm):  7   Depth (mm):  5 Repair type:    Repair type:  Simple Pre-procedure details:    Preparation:  Patient was prepped and draped in usual sterile fashion and imaging obtained to evaluate for foreign bodies Exploration:    Hemostasis achieved with:  Direct pressure   Wound exploration: wound explored through full range of motion and entire depth of wound probed and visualized     Wound extent: no fascia violation noted, no foreign bodies/material noted and no tendon damage noted     Contaminated: no   Treatment:    Area cleansed with:  Saline   Amount of cleaning:  Extensive   Irrigation solution:  Sterile saline   Irrigation method:  Syringe Skin repair:    Repair method:  Sutures   Suture size:  4-0   Suture material:  Prolene  Suture technique:  Simple interrupted   Number of sutures:  6 Approximation:    Approximation:  Close Post-procedure details:    Dressing:  Antibiotic ointment, non-adherent dressing and splint for protection   Patient tolerance of procedure:  Tolerated well, no immediate complications   (including critical care time)  Medications Ordered in ED Medications  lidocaine-EPINEPHrine (XYLOCAINE W/EPI) 2 %-1:200000 (PF) injection 10 mL (10 mLs Infiltration Given by Other 12/24/17 1635)  Tdap (BOOSTRIX) injection 0.5 mL (0.5 mLs Intramuscular Given 12/24/17 1634)     Initial Impression / Assessment and Plan / ED Course  I have reviewed the triage vital signs and the nursing notes.  Pertinent labs & imaging results that were available during my care of the patient were reviewed by me and considered in my medical decision making (see chart for details).     47 y.o. male here with R hand lac sustained ~1hr ago, he was using a chop saw and brought his hand up and accidentally cut it on the saw blade. On exam, ~7cm fairly linear laceration to the dorsum of the R hand, crossing over the 3rd MCP joint, doesn't  appear to disrupt the fascia or tendons, NVI with soft compartments, FROM intact in all joints of the hand/fingers, mild tenderness around the wound along the 2nd and 3rd MCP joint areas but otherwise no tenderness elsewhere. Bleeding controlled. No obvious retained FBs. Will update Tdap, cleanse wound, and obtain xray to r/o underlying injury. Will plan for suture repair. Will reassess shortly. Pt declines needing anything for pain.   4:44 PM Xray negative for retained FB or underlying fx. Wound repaired with 4-0 prolene x6 simple interrupted sutures. Will apply splint to avoid premature opening of the wound with finger flexion. Doubt need for ppx abx. Advised proper wound care, tylenol/motrin for pain, and f/up with PCP in 3 days for wound check and again in 7-10 days for suture removal. I explained the diagnosis and have given explicit precautions to return to the ER including for any other new or worsening symptoms. The patient understands and accepts the medical plan as it's been dictated and I have answered their questions. Discharge instructions concerning home care and prescriptions have been given. The patient is STABLE and is discharged to home in good condition.    Final Clinical Impressions(s) / ED Diagnoses   Final diagnoses:  Laceration of right hand without foreign body, initial encounter    ED Discharge Orders    [address] [place], [place] 12/24/17 1644    Blanchie Dessert, MD 12/25/17 2056

## 2018-01-27 DIAGNOSIS — G4733 Obstructive sleep apnea (adult) (pediatric): Secondary | ICD-10-CM | POA: Diagnosis not present

## 2018-04-20 DIAGNOSIS — H43811 Vitreous degeneration, right eye: Secondary | ICD-10-CM | POA: Diagnosis not present

## 2018-06-04 IMAGING — CR DG HAND 2V*R*
3 series · 3 of 3 positions shown · non-contrast
Comparison: None.

CLINICAL DATA: Laceration.

EXAM:
RIGHT HAND - 2 VIEW

[x hand pa right]
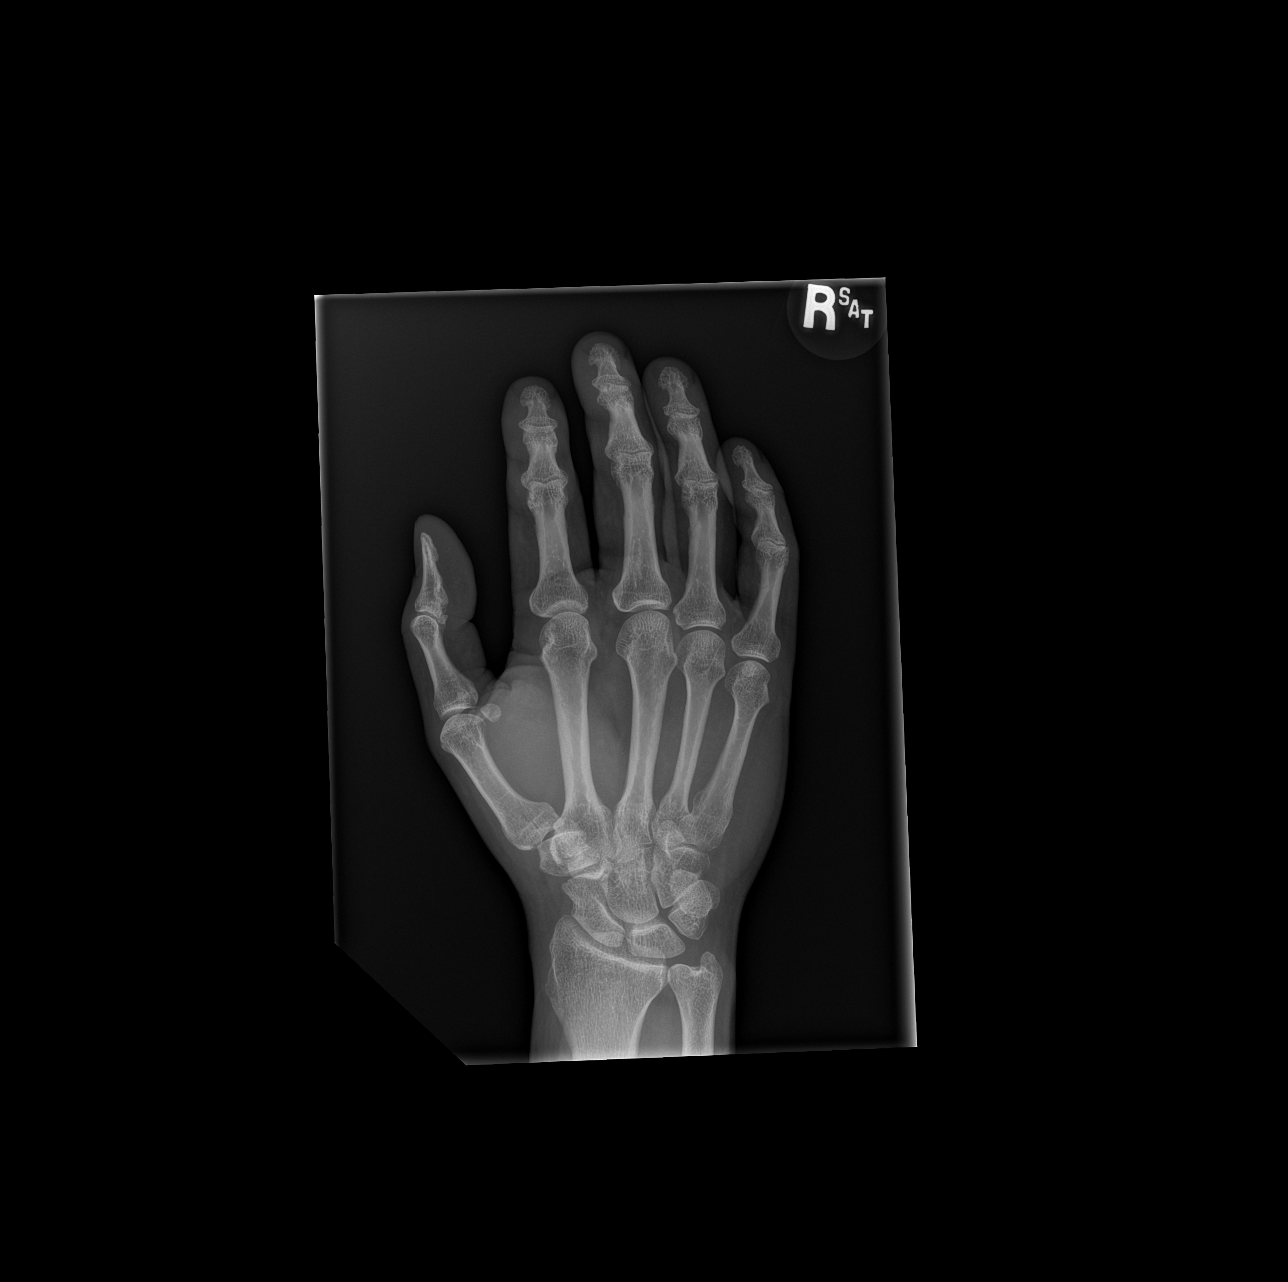

[x hand lat right (1 of 2)]
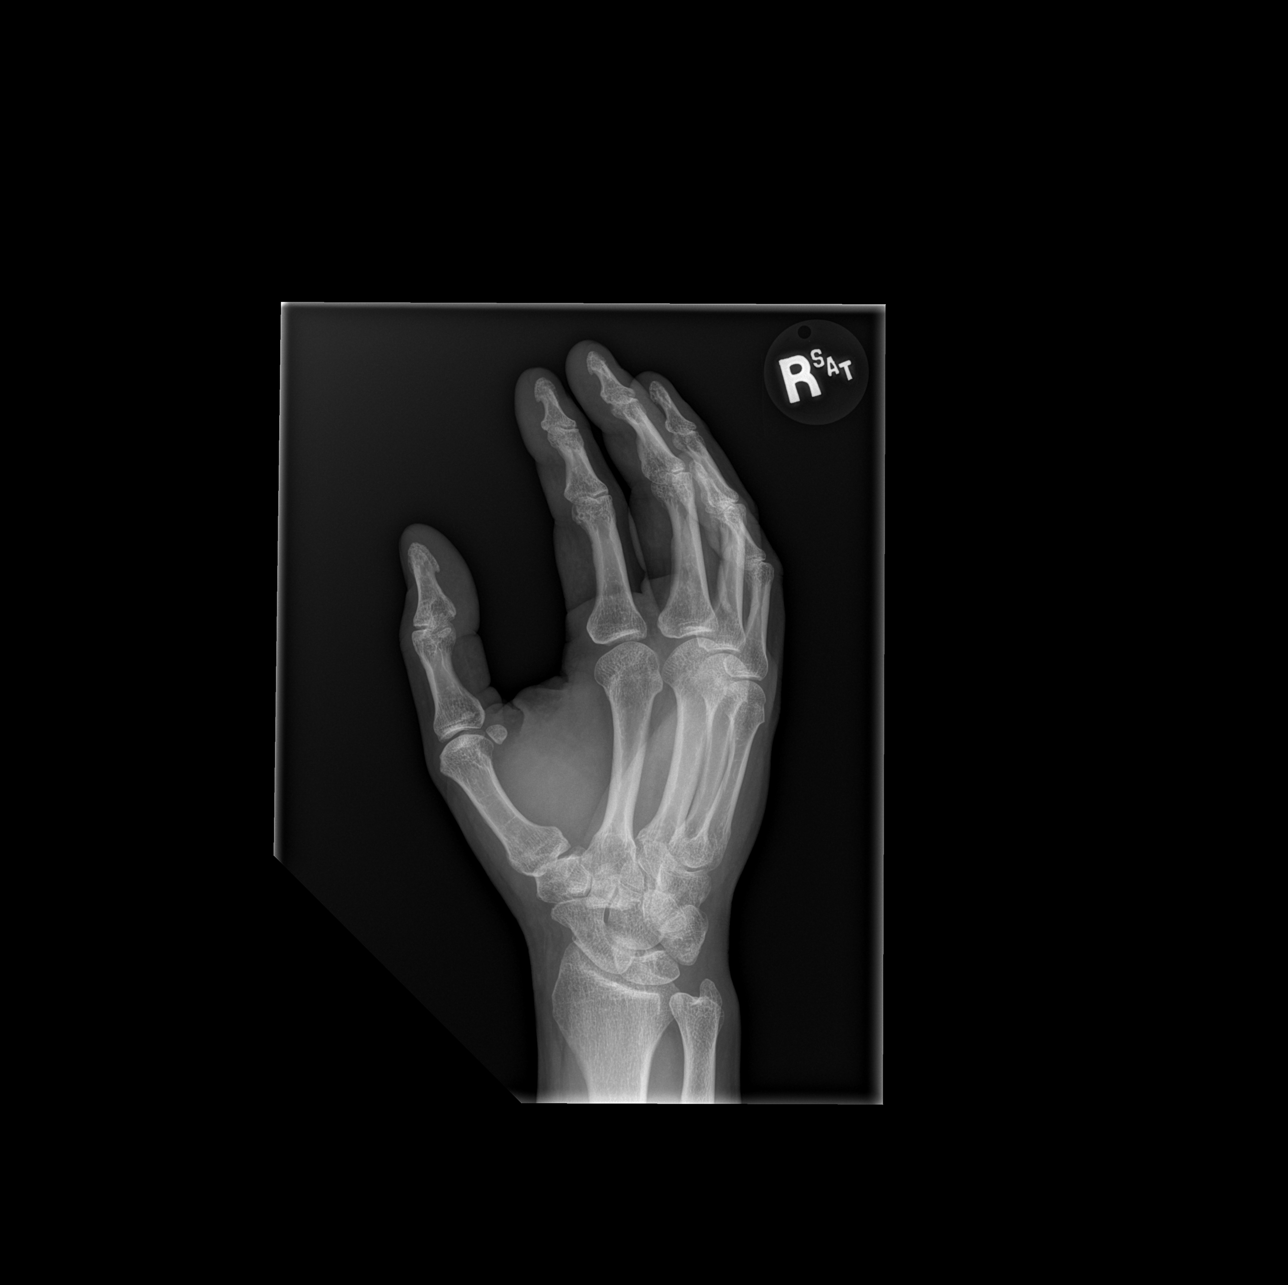

[x hand lat right (2 of 2)]
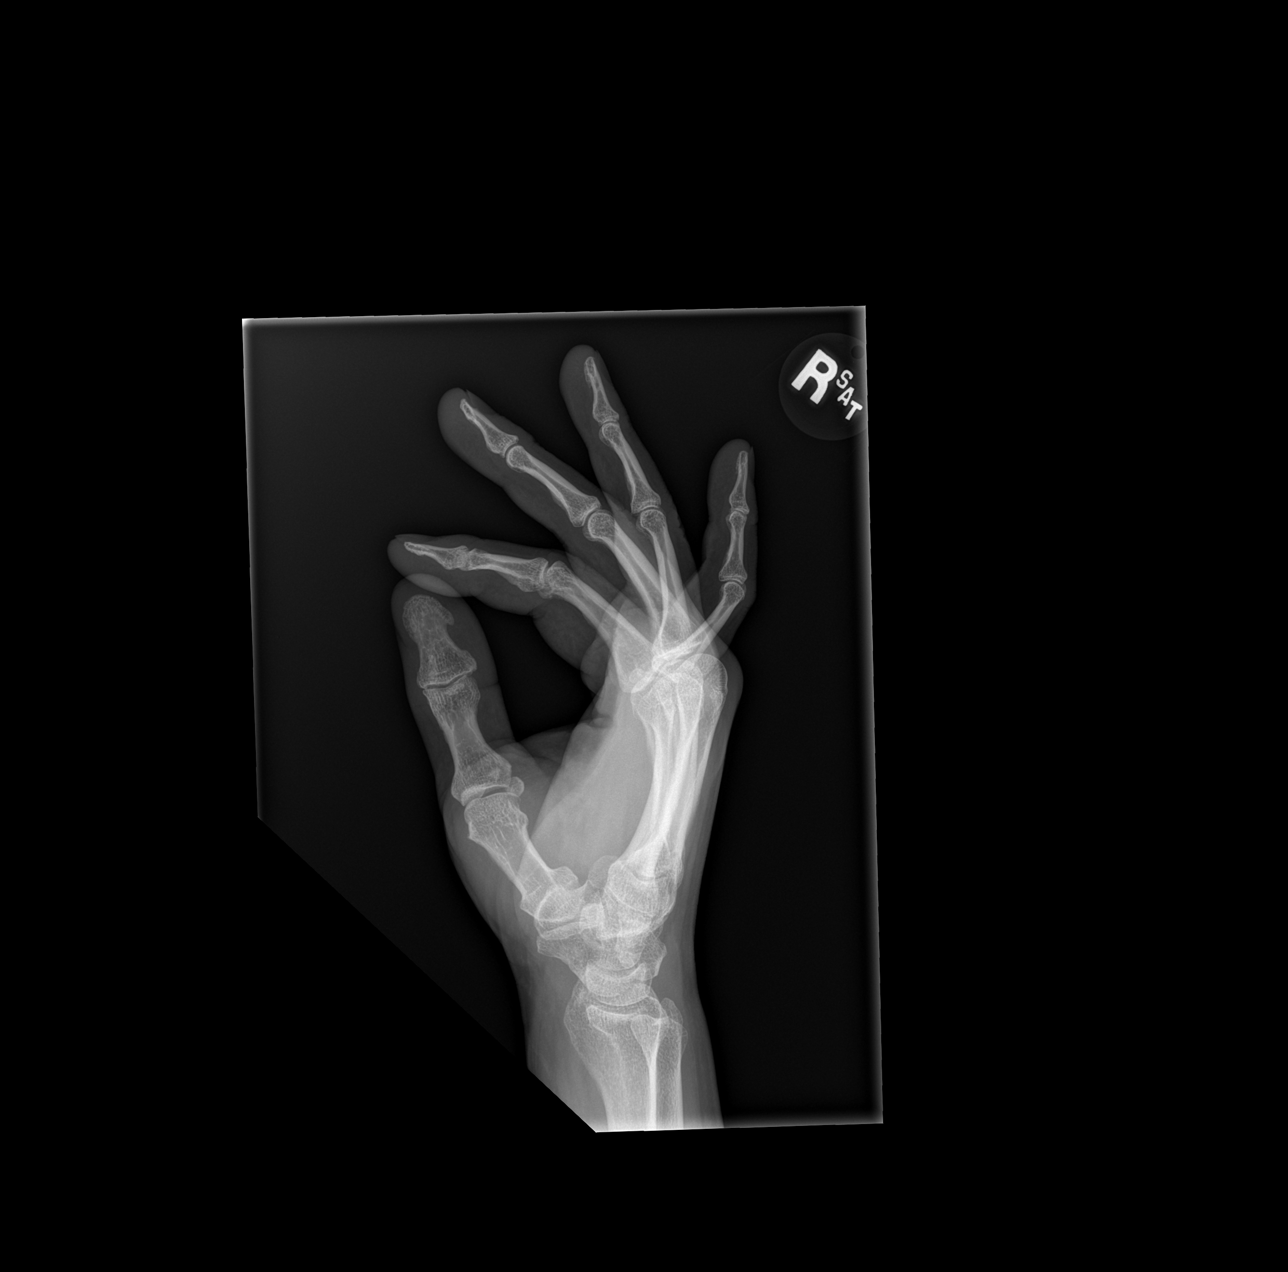

[3 of 3 positions shown; findings below may reference images not displayed]

FINDINGS: There is no evidence of fracture or dislocation. There is no
evidence of arthropathy or other focal bone abnormality. No evidence
for radiopaque soft tissue foreign body.
IMPRESSION: Negative.

## 2018-07-14 DIAGNOSIS — Z23 Encounter for immunization: Secondary | ICD-10-CM | POA: Diagnosis not present

## 2018-08-31 DIAGNOSIS — Z0000 Encounter for general adult medical examination without abnormal findings: Secondary | ICD-10-CM | POA: Diagnosis not present

## 2018-09-05 DIAGNOSIS — Z0000 Encounter for general adult medical examination without abnormal findings: Secondary | ICD-10-CM | POA: Diagnosis not present

## 2018-09-05 DIAGNOSIS — N529 Male erectile dysfunction, unspecified: Secondary | ICD-10-CM | POA: Diagnosis not present

## 2018-09-05 DIAGNOSIS — E782 Mixed hyperlipidemia: Secondary | ICD-10-CM | POA: Diagnosis not present

## 2018-11-20 DIAGNOSIS — H01001 Unspecified blepharitis right upper eyelid: Secondary | ICD-10-CM | POA: Diagnosis not present

## 2018-11-20 DIAGNOSIS — H01004 Unspecified blepharitis left upper eyelid: Secondary | ICD-10-CM | POA: Diagnosis not present

## 2018-12-11 DIAGNOSIS — G4733 Obstructive sleep apnea (adult) (pediatric): Secondary | ICD-10-CM | POA: Diagnosis not present

## 2019-12-13 ENCOUNTER — Ambulatory Visit: Payer: 59 | Attending: Internal Medicine

## 2019-12-13 DIAGNOSIS — Z23 Encounter for immunization: Secondary | ICD-10-CM

## 2019-12-13 NOTE — Progress Notes (Signed)
   Covid-19 Vaccination Clinic  Name:  Joshua Ochoa    MRN: [id number] DOB: Dec 12, 1970  12/13/2019  Joshua Ochoa was observed post Covid-19 immunization for 30 minutes based on pre-vaccination screening without incident. He was provided with Vaccine Information Sheet and instruction to access the V-Safe system.   Joshua Ochoa was instructed to call 911 with any severe reactions post vaccine: Marland Kitchen Difficulty breathing  . Swelling of face and throat  . A fast heartbeat  . A bad rash all over body  . Dizziness and weakness   Immunizations Administered    Name Date Dose VIS Date Route   Pfizer COVID-19 Vaccine 12/13/2019  9:26 AM 0.3 mL 08/31/2019 Intramuscular   Manufacturer: [place]   Lot: [id number]   [place]: [id number]

## 2020-01-07 ENCOUNTER — Ambulatory Visit: Payer: 59 | Attending: Internal Medicine

## 2020-01-07 DIAGNOSIS — Z23 Encounter for immunization: Secondary | ICD-10-CM

## 2020-01-07 NOTE — Progress Notes (Signed)
   Covid-19 Vaccination Clinic  Name:  Joshua Ochoa    MRN: [id number] DOB: 10-19-1970  01/07/2020  Mr. [name] was observed post Covid-19 immunization for 15 minutes without incident. He was provided with Vaccine Information Sheet and instruction to access the V-Safe system.   Mr. [name] was instructed to call 911 with any severe reactions post vaccine: Marland Kitchen Difficulty breathing  . Swelling of face and throat  . A fast heartbeat  . A bad rash all over body  . Dizziness and weakness   Immunizations Administered    Name Date Dose VIS Date Route   Pfizer COVID-19 Vaccine 01/07/2020  9:16 AM 0.3 mL 11/14/2018 Intramuscular   Manufacturer: [place]   Lot: [id number]   [place]: [id number]

## 2020-03-28 DIAGNOSIS — F332 Major depressive disorder, recurrent severe without psychotic features: Secondary | ICD-10-CM | POA: Diagnosis not present

## 2020-04-03 DIAGNOSIS — F332 Major depressive disorder, recurrent severe without psychotic features: Secondary | ICD-10-CM | POA: Diagnosis not present

## 2020-04-14 DIAGNOSIS — F332 Major depressive disorder, recurrent severe without psychotic features: Secondary | ICD-10-CM | POA: Diagnosis not present

## 2020-04-22 DIAGNOSIS — F332 Major depressive disorder, recurrent severe without psychotic features: Secondary | ICD-10-CM | POA: Diagnosis not present

## 2020-05-06 DIAGNOSIS — F332 Major depressive disorder, recurrent severe without psychotic features: Secondary | ICD-10-CM | POA: Diagnosis not present

## 2020-05-14 DIAGNOSIS — F332 Major depressive disorder, recurrent severe without psychotic features: Secondary | ICD-10-CM | POA: Diagnosis not present

## 2020-05-30 DIAGNOSIS — F332 Major depressive disorder, recurrent severe without psychotic features: Secondary | ICD-10-CM | POA: Diagnosis not present

## 2020-06-05 DIAGNOSIS — F332 Major depressive disorder, recurrent severe without psychotic features: Secondary | ICD-10-CM | POA: Diagnosis not present

## 2020-06-24 DIAGNOSIS — F332 Major depressive disorder, recurrent severe without psychotic features: Secondary | ICD-10-CM | POA: Diagnosis not present

## 2020-07-11 DIAGNOSIS — G4733 Obstructive sleep apnea (adult) (pediatric): Secondary | ICD-10-CM | POA: Diagnosis not present

## 2020-07-15 DIAGNOSIS — F332 Major depressive disorder, recurrent severe without psychotic features: Secondary | ICD-10-CM | POA: Diagnosis not present

## 2020-07-29 DIAGNOSIS — F332 Major depressive disorder, recurrent severe without psychotic features: Secondary | ICD-10-CM | POA: Diagnosis not present

## 2020-08-22 DIAGNOSIS — F332 Major depressive disorder, recurrent severe without psychotic features: Secondary | ICD-10-CM | POA: Diagnosis not present

## 2020-08-27 DIAGNOSIS — F332 Major depressive disorder, recurrent severe without psychotic features: Secondary | ICD-10-CM | POA: Diagnosis not present

## 2020-09-08 DIAGNOSIS — F332 Major depressive disorder, recurrent severe without psychotic features: Secondary | ICD-10-CM | POA: Diagnosis not present

## 2020-09-30 DIAGNOSIS — F332 Major depressive disorder, recurrent severe without psychotic features: Secondary | ICD-10-CM | POA: Diagnosis not present

## 2020-10-03 DIAGNOSIS — E782 Mixed hyperlipidemia: Secondary | ICD-10-CM | POA: Diagnosis not present

## 2020-10-03 DIAGNOSIS — R7401 Elevation of levels of liver transaminase levels: Secondary | ICD-10-CM | POA: Diagnosis not present

## 2020-10-03 DIAGNOSIS — Z125 Encounter for screening for malignant neoplasm of prostate: Secondary | ICD-10-CM | POA: Diagnosis not present

## 2020-10-03 DIAGNOSIS — Z0000 Encounter for general adult medical examination without abnormal findings: Secondary | ICD-10-CM | POA: Diagnosis not present

## 2020-10-15 DIAGNOSIS — F332 Major depressive disorder, recurrent severe without psychotic features: Secondary | ICD-10-CM | POA: Diagnosis not present

## 2020-10-24 DIAGNOSIS — B353 Tinea pedis: Secondary | ICD-10-CM | POA: Diagnosis not present

## 2020-10-24 DIAGNOSIS — Z0000 Encounter for general adult medical examination without abnormal findings: Secondary | ICD-10-CM | POA: Diagnosis not present

## 2020-10-24 DIAGNOSIS — G4733 Obstructive sleep apnea (adult) (pediatric): Secondary | ICD-10-CM | POA: Diagnosis not present

## 2020-10-24 DIAGNOSIS — N401 Enlarged prostate with lower urinary tract symptoms: Secondary | ICD-10-CM | POA: Diagnosis not present

## 2020-10-24 DIAGNOSIS — E782 Mixed hyperlipidemia: Secondary | ICD-10-CM | POA: Diagnosis not present

## 2020-11-07 DIAGNOSIS — F332 Major depressive disorder, recurrent severe without psychotic features: Secondary | ICD-10-CM | POA: Diagnosis not present

## 2020-11-18 DIAGNOSIS — F332 Major depressive disorder, recurrent severe without psychotic features: Secondary | ICD-10-CM | POA: Diagnosis not present

## 2020-12-03 DIAGNOSIS — F332 Major depressive disorder, recurrent severe without psychotic features: Secondary | ICD-10-CM | POA: Diagnosis not present

## 2020-12-16 DIAGNOSIS — F332 Major depressive disorder, recurrent severe without psychotic features: Secondary | ICD-10-CM | POA: Diagnosis not present

## 2021-01-02 DIAGNOSIS — F332 Major depressive disorder, recurrent severe without psychotic features: Secondary | ICD-10-CM | POA: Diagnosis not present

## 2021-01-20 DIAGNOSIS — F332 Major depressive disorder, recurrent severe without psychotic features: Secondary | ICD-10-CM | POA: Diagnosis not present

## 2021-02-05 DIAGNOSIS — F332 Major depressive disorder, recurrent severe without psychotic features: Secondary | ICD-10-CM | POA: Diagnosis not present

## 2021-02-12 DIAGNOSIS — G4733 Obstructive sleep apnea (adult) (pediatric): Secondary | ICD-10-CM | POA: Diagnosis not present

## 2021-02-19 DIAGNOSIS — F332 Major depressive disorder, recurrent severe without psychotic features: Secondary | ICD-10-CM | POA: Diagnosis not present

## 2021-03-10 DIAGNOSIS — F332 Major depressive disorder, recurrent severe without psychotic features: Secondary | ICD-10-CM | POA: Diagnosis not present

## 2021-03-28 DIAGNOSIS — F3342 Major depressive disorder, recurrent, in full remission: Secondary | ICD-10-CM | POA: Diagnosis not present

## 2021-03-30 DIAGNOSIS — F332 Major depressive disorder, recurrent severe without psychotic features: Secondary | ICD-10-CM | POA: Diagnosis not present

## 2021-10-29 DIAGNOSIS — G4733 Obstructive sleep apnea (adult) (pediatric): Secondary | ICD-10-CM | POA: Diagnosis not present

## 2021-11-06 DIAGNOSIS — Z0000 Encounter for general adult medical examination without abnormal findings: Secondary | ICD-10-CM | POA: Diagnosis not present

## 2021-11-06 DIAGNOSIS — Z125 Encounter for screening for malignant neoplasm of prostate: Secondary | ICD-10-CM | POA: Diagnosis not present

## 2021-11-13 DIAGNOSIS — N401 Enlarged prostate with lower urinary tract symptoms: Secondary | ICD-10-CM | POA: Diagnosis not present

## 2021-11-13 DIAGNOSIS — Z0000 Encounter for general adult medical examination without abnormal findings: Secondary | ICD-10-CM | POA: Diagnosis not present

## 2021-11-13 DIAGNOSIS — E782 Mixed hyperlipidemia: Secondary | ICD-10-CM | POA: Diagnosis not present

## 2021-11-26 DIAGNOSIS — G4733 Obstructive sleep apnea (adult) (pediatric): Secondary | ICD-10-CM | POA: Diagnosis not present

## 2021-12-27 DIAGNOSIS — G4733 Obstructive sleep apnea (adult) (pediatric): Secondary | ICD-10-CM | POA: Diagnosis not present

## 2022-02-09 DIAGNOSIS — L821 Other seborrheic keratosis: Secondary | ICD-10-CM | POA: Diagnosis not present

## 2022-02-09 DIAGNOSIS — I788 Other diseases of capillaries: Secondary | ICD-10-CM | POA: Diagnosis not present

## 2022-02-10 DIAGNOSIS — G4733 Obstructive sleep apnea (adult) (pediatric): Secondary | ICD-10-CM | POA: Diagnosis not present

## 2022-05-17 DIAGNOSIS — F411 Generalized anxiety disorder: Secondary | ICD-10-CM | POA: Diagnosis not present

## 2022-05-17 DIAGNOSIS — F3342 Major depressive disorder, recurrent, in full remission: Secondary | ICD-10-CM | POA: Diagnosis not present

## 2022-11-17 DIAGNOSIS — G4733 Obstructive sleep apnea (adult) (pediatric): Secondary | ICD-10-CM | POA: Diagnosis not present

## 2022-11-18 DIAGNOSIS — Z0000 Encounter for general adult medical examination without abnormal findings: Secondary | ICD-10-CM | POA: Diagnosis not present

## 2022-11-18 DIAGNOSIS — E782 Mixed hyperlipidemia: Secondary | ICD-10-CM | POA: Diagnosis not present

## 2022-11-18 DIAGNOSIS — N401 Enlarged prostate with lower urinary tract symptoms: Secondary | ICD-10-CM | POA: Diagnosis not present

## 2022-11-25 DIAGNOSIS — Z125 Encounter for screening for malignant neoplasm of prostate: Secondary | ICD-10-CM | POA: Diagnosis not present

## 2022-11-25 DIAGNOSIS — N401 Enlarged prostate with lower urinary tract symptoms: Secondary | ICD-10-CM | POA: Diagnosis not present

## 2022-11-25 DIAGNOSIS — Z0000 Encounter for general adult medical examination without abnormal findings: Secondary | ICD-10-CM | POA: Diagnosis not present

## 2022-11-25 DIAGNOSIS — E782 Mixed hyperlipidemia: Secondary | ICD-10-CM | POA: Diagnosis not present

## 2022-12-16 DIAGNOSIS — G4733 Obstructive sleep apnea (adult) (pediatric): Secondary | ICD-10-CM | POA: Diagnosis not present

## 2023-01-16 DIAGNOSIS — G4733 Obstructive sleep apnea (adult) (pediatric): Secondary | ICD-10-CM | POA: Diagnosis not present

## 2023-03-22 DIAGNOSIS — G4733 Obstructive sleep apnea (adult) (pediatric): Secondary | ICD-10-CM | POA: Diagnosis not present

## 2023-04-20 DIAGNOSIS — H25043 Posterior subcapsular polar age-related cataract, bilateral: Secondary | ICD-10-CM | POA: Diagnosis not present

## 2023-04-20 DIAGNOSIS — H5213 Myopia, bilateral: Secondary | ICD-10-CM | POA: Diagnosis not present

## 2023-05-05 DIAGNOSIS — F3342 Major depressive disorder, recurrent, in full remission: Secondary | ICD-10-CM | POA: Diagnosis not present

## 2023-05-05 DIAGNOSIS — F4322 Adjustment disorder with anxiety: Secondary | ICD-10-CM | POA: Diagnosis not present

## 2023-05-05 DIAGNOSIS — F40248 Other situational type phobia: Secondary | ICD-10-CM | POA: Diagnosis not present

## 2023-05-05 DIAGNOSIS — F411 Generalized anxiety disorder: Secondary | ICD-10-CM | POA: Diagnosis not present

## 2023-05-31 DIAGNOSIS — F332 Major depressive disorder, recurrent severe without psychotic features: Secondary | ICD-10-CM | POA: Diagnosis not present

## 2023-06-08 DIAGNOSIS — F332 Major depressive disorder, recurrent severe without psychotic features: Secondary | ICD-10-CM | POA: Diagnosis not present

## 2023-06-09 DIAGNOSIS — F332 Major depressive disorder, recurrent severe without psychotic features: Secondary | ICD-10-CM | POA: Diagnosis not present

## 2023-06-17 DIAGNOSIS — F332 Major depressive disorder, recurrent severe without psychotic features: Secondary | ICD-10-CM | POA: Diagnosis not present

## 2023-06-20 DIAGNOSIS — F332 Major depressive disorder, recurrent severe without psychotic features: Secondary | ICD-10-CM | POA: Diagnosis not present

## 2023-06-27 DIAGNOSIS — F332 Major depressive disorder, recurrent severe without psychotic features: Secondary | ICD-10-CM | POA: Diagnosis not present

## 2023-06-29 DIAGNOSIS — Z800 Family history of malignant neoplasm of digestive organs: Secondary | ICD-10-CM | POA: Diagnosis not present

## 2023-06-29 DIAGNOSIS — Z860100 Personal history of colon polyps, unspecified: Secondary | ICD-10-CM | POA: Diagnosis not present

## 2023-06-29 DIAGNOSIS — K5730 Diverticulosis of large intestine without perforation or abscess without bleeding: Secondary | ICD-10-CM | POA: Diagnosis not present

## 2023-06-29 DIAGNOSIS — K635 Polyp of colon: Secondary | ICD-10-CM | POA: Diagnosis not present

## 2023-07-15 DIAGNOSIS — F332 Major depressive disorder, recurrent severe without psychotic features: Secondary | ICD-10-CM | POA: Diagnosis not present

## 2023-07-20 DIAGNOSIS — G4733 Obstructive sleep apnea (adult) (pediatric): Secondary | ICD-10-CM | POA: Diagnosis not present

## 2023-07-20 DIAGNOSIS — F332 Major depressive disorder, recurrent severe without psychotic features: Secondary | ICD-10-CM | POA: Diagnosis not present

## 2023-07-25 DIAGNOSIS — F332 Major depressive disorder, recurrent severe without psychotic features: Secondary | ICD-10-CM | POA: Diagnosis not present

## 2023-07-28 DIAGNOSIS — F332 Major depressive disorder, recurrent severe without psychotic features: Secondary | ICD-10-CM | POA: Diagnosis not present

## 2023-08-05 DIAGNOSIS — F332 Major depressive disorder, recurrent severe without psychotic features: Secondary | ICD-10-CM | POA: Diagnosis not present

## 2023-08-10 DIAGNOSIS — F332 Major depressive disorder, recurrent severe without psychotic features: Secondary | ICD-10-CM | POA: Diagnosis not present

## 2023-08-16 DIAGNOSIS — F332 Major depressive disorder, recurrent severe without psychotic features: Secondary | ICD-10-CM | POA: Diagnosis not present

## 2023-08-20 DIAGNOSIS — G4733 Obstructive sleep apnea (adult) (pediatric): Secondary | ICD-10-CM | POA: Diagnosis not present

## 2023-08-22 DIAGNOSIS — F332 Major depressive disorder, recurrent severe without psychotic features: Secondary | ICD-10-CM | POA: Diagnosis not present

## 2023-08-29 DIAGNOSIS — F332 Major depressive disorder, recurrent severe without psychotic features: Secondary | ICD-10-CM | POA: Diagnosis not present

## 2023-08-30 DIAGNOSIS — F332 Major depressive disorder, recurrent severe without psychotic features: Secondary | ICD-10-CM | POA: Diagnosis not present

## 2023-09-05 DIAGNOSIS — F332 Major depressive disorder, recurrent severe without psychotic features: Secondary | ICD-10-CM | POA: Diagnosis not present

## 2023-09-07 DIAGNOSIS — F332 Major depressive disorder, recurrent severe without psychotic features: Secondary | ICD-10-CM | POA: Diagnosis not present

## 2023-09-19 DIAGNOSIS — G4733 Obstructive sleep apnea (adult) (pediatric): Secondary | ICD-10-CM | POA: Diagnosis not present

## 2023-09-22 DIAGNOSIS — F332 Major depressive disorder, recurrent severe without psychotic features: Secondary | ICD-10-CM | POA: Diagnosis not present

## 2023-09-27 DIAGNOSIS — F332 Major depressive disorder, recurrent severe without psychotic features: Secondary | ICD-10-CM | POA: Diagnosis not present

## 2023-10-04 DIAGNOSIS — F332 Major depressive disorder, recurrent severe without psychotic features: Secondary | ICD-10-CM | POA: Diagnosis not present

## 2023-10-07 DIAGNOSIS — F332 Major depressive disorder, recurrent severe without psychotic features: Secondary | ICD-10-CM | POA: Diagnosis not present

## 2023-10-11 DIAGNOSIS — F332 Major depressive disorder, recurrent severe without psychotic features: Secondary | ICD-10-CM | POA: Diagnosis not present

## 2023-10-14 DIAGNOSIS — F332 Major depressive disorder, recurrent severe without psychotic features: Secondary | ICD-10-CM | POA: Diagnosis not present

## 2023-10-17 DIAGNOSIS — F332 Major depressive disorder, recurrent severe without psychotic features: Secondary | ICD-10-CM | POA: Diagnosis not present

## 2023-10-20 DIAGNOSIS — F332 Major depressive disorder, recurrent severe without psychotic features: Secondary | ICD-10-CM | POA: Diagnosis not present

## 2023-10-25 DIAGNOSIS — F332 Major depressive disorder, recurrent severe without psychotic features: Secondary | ICD-10-CM | POA: Diagnosis not present

## 2023-10-26 DIAGNOSIS — F332 Major depressive disorder, recurrent severe without psychotic features: Secondary | ICD-10-CM | POA: Diagnosis not present

## 2023-10-31 DIAGNOSIS — F332 Major depressive disorder, recurrent severe without psychotic features: Secondary | ICD-10-CM | POA: Diagnosis not present

## 2023-11-01 DIAGNOSIS — F332 Major depressive disorder, recurrent severe without psychotic features: Secondary | ICD-10-CM | POA: Diagnosis not present

## 2023-11-02 DIAGNOSIS — F40248 Other situational type phobia: Secondary | ICD-10-CM | POA: Diagnosis not present

## 2023-11-02 DIAGNOSIS — F3341 Major depressive disorder, recurrent, in partial remission: Secondary | ICD-10-CM | POA: Diagnosis not present

## 2023-11-07 DIAGNOSIS — F332 Major depressive disorder, recurrent severe without psychotic features: Secondary | ICD-10-CM | POA: Diagnosis not present

## 2023-11-16 DIAGNOSIS — F332 Major depressive disorder, recurrent severe without psychotic features: Secondary | ICD-10-CM | POA: Diagnosis not present

## 2023-11-21 DIAGNOSIS — F332 Major depressive disorder, recurrent severe without psychotic features: Secondary | ICD-10-CM | POA: Diagnosis not present

## 2023-11-23 DIAGNOSIS — Z0000 Encounter for general adult medical examination without abnormal findings: Secondary | ICD-10-CM | POA: Diagnosis not present

## 2023-11-23 DIAGNOSIS — E782 Mixed hyperlipidemia: Secondary | ICD-10-CM | POA: Diagnosis not present

## 2023-11-23 DIAGNOSIS — Z125 Encounter for screening for malignant neoplasm of prostate: Secondary | ICD-10-CM | POA: Diagnosis not present

## 2023-11-23 DIAGNOSIS — F332 Major depressive disorder, recurrent severe without psychotic features: Secondary | ICD-10-CM | POA: Diagnosis not present

## 2023-11-23 DIAGNOSIS — N401 Enlarged prostate with lower urinary tract symptoms: Secondary | ICD-10-CM | POA: Diagnosis not present

## 2023-11-28 DIAGNOSIS — F332 Major depressive disorder, recurrent severe without psychotic features: Secondary | ICD-10-CM | POA: Diagnosis not present

## 2023-11-30 ENCOUNTER — Other Ambulatory Visit (HOSPITAL_COMMUNITY): Payer: Self-pay | Admitting: Internal Medicine

## 2023-11-30 DIAGNOSIS — Z0000 Encounter for general adult medical examination without abnormal findings: Secondary | ICD-10-CM | POA: Diagnosis not present

## 2023-11-30 DIAGNOSIS — E782 Mixed hyperlipidemia: Secondary | ICD-10-CM

## 2023-12-07 ENCOUNTER — Ambulatory Visit (HOSPITAL_COMMUNITY)
Admission: RE | Admit: 2023-12-07 | Discharge: 2023-12-07 | Disposition: A | Discharge status: Home / Self Care | Payer: Self-pay | Source: Ambulatory Visit | Attending: Internal Medicine | Admitting: Internal Medicine

## 2023-12-07 DIAGNOSIS — F332 Major depressive disorder, recurrent severe without psychotic features: Secondary | ICD-10-CM | POA: Diagnosis not present

## 2023-12-07 DIAGNOSIS — E782 Mixed hyperlipidemia: Secondary | ICD-10-CM | POA: Insufficient documentation

## 2023-12-28 DIAGNOSIS — F332 Major depressive disorder, recurrent severe without psychotic features: Secondary | ICD-10-CM | POA: Diagnosis not present

## 2024-01-02 DIAGNOSIS — F332 Major depressive disorder, recurrent severe without psychotic features: Secondary | ICD-10-CM | POA: Diagnosis not present

## 2024-01-03 DIAGNOSIS — F332 Major depressive disorder, recurrent severe without psychotic features: Secondary | ICD-10-CM | POA: Diagnosis not present

## 2024-01-19 DIAGNOSIS — F332 Major depressive disorder, recurrent severe without psychotic features: Secondary | ICD-10-CM | POA: Diagnosis not present

## 2024-01-30 DIAGNOSIS — G4733 Obstructive sleep apnea (adult) (pediatric): Secondary | ICD-10-CM | POA: Diagnosis not present

## 2024-02-15 DIAGNOSIS — F332 Major depressive disorder, recurrent severe without psychotic features: Secondary | ICD-10-CM | POA: Diagnosis not present

## 2024-02-20 DIAGNOSIS — F332 Major depressive disorder, recurrent severe without psychotic features: Secondary | ICD-10-CM | POA: Diagnosis not present

## 2024-02-21 DIAGNOSIS — G4733 Obstructive sleep apnea (adult) (pediatric): Secondary | ICD-10-CM | POA: Diagnosis not present

## 2024-03-01 DIAGNOSIS — G4733 Obstructive sleep apnea (adult) (pediatric): Secondary | ICD-10-CM | POA: Diagnosis not present

## 2024-03-01 DIAGNOSIS — F332 Major depressive disorder, recurrent severe without psychotic features: Secondary | ICD-10-CM | POA: Diagnosis not present

## 2024-03-06 DIAGNOSIS — F332 Major depressive disorder, recurrent severe without psychotic features: Secondary | ICD-10-CM | POA: Diagnosis not present

## 2024-03-09 DIAGNOSIS — G4733 Obstructive sleep apnea (adult) (pediatric): Secondary | ICD-10-CM | POA: Diagnosis not present

## 2024-03-29 DIAGNOSIS — H0012 Chalazion right lower eyelid: Secondary | ICD-10-CM | POA: Diagnosis not present

## 2024-03-31 DIAGNOSIS — G4733 Obstructive sleep apnea (adult) (pediatric): Secondary | ICD-10-CM | POA: Diagnosis not present

## 2024-04-23 DIAGNOSIS — H25043 Posterior subcapsular polar age-related cataract, bilateral: Secondary | ICD-10-CM | POA: Diagnosis not present

## 2024-04-23 DIAGNOSIS — H5213 Myopia, bilateral: Secondary | ICD-10-CM | POA: Diagnosis not present

## 2024-04-25 ENCOUNTER — Emergency Department (HOSPITAL_COMMUNITY)
Admission: EM | Admit: 2024-04-25 | Discharge: 2024-04-25 | Disposition: A | Discharge status: Home / Self Care | Attending: Emergency Medicine | Admitting: Emergency Medicine

## 2024-04-25 ENCOUNTER — Encounter (HOSPITAL_COMMUNITY): Payer: Self-pay

## 2024-04-25 ENCOUNTER — Other Ambulatory Visit: Payer: Self-pay

## 2024-04-25 DIAGNOSIS — R339 Retention of urine, unspecified: Secondary | ICD-10-CM | POA: Insufficient documentation

## 2024-04-25 LAB — URINALYSIS, ROUTINE W REFLEX MICROSCOPIC
Bilirubin Urine: NEGATIVE
Glucose, UA: NEGATIVE mg/dL
Hgb urine dipstick: NEGATIVE
Ketones, ur: NEGATIVE mg/dL
Leukocytes,Ua: NEGATIVE
Nitrite: NEGATIVE
Protein, ur: NEGATIVE mg/dL
Specific Gravity, Urine: 1.01 (ref 1.005–1.030)
pH: 5 (ref 5.0–8.0)

## 2024-04-25 NOTE — ED Provider Notes (Signed)
 [facility] EMERGENCY DEPARTMENT AT [facility] Provider Note   CSN: [id number] Arrival date & time: 04/25/24  9540     Patient presents with: Urinary Retention   Joshua Ochoa is a 53 y.o. male.   The history is provided by the patient and the spouse.  Patient reports that he has had urinary retention for up to 12 hours. He reports lower abdominal discomfort, but no fevers or vomiting.  He reports he has had this previously and was felt to be due to constipation.  Patient has had a Foley catheter before He had otherwise been at his baseline prior to this episode He reports that he has his prostate checked every year and no issues   Past Medical History:  Diagnosis Date   Anxiety    Hyperlipidemia     Prior to Admission medications   Medication Sig Start Date End Date Taking? Authorizing Provider  atorvastatin (LIPITOR) 40 MG tablet Take 40 mg by mouth daily.    [provider]  BuPROPion HCl (WELLBUTRIN PO) Take 450 mg by mouth.    [provider]  Cholecalciferol (VITAMIN D-3 PO) Take by mouth.    [provider]  escitalopram (LEXAPRO) 20 MG tablet Take 20 mg by mouth daily.    [provider]    Allergies: Shellfish allergy    Review of Systems  Constitutional:  Negative for fever.  Gastrointestinal:  Positive for constipation. Negative for vomiting.  Genitourinary:  Positive for difficulty urinating.    Updated Vital Signs BP 134/87 (BP Location: Right Arm)   Pulse (!) 113   Temp 98.1 F (36.7 C) (Oral)   Resp 18   Ht 1.854 m (6' 1)   Wt 102.1 kg   SpO2 97%   BMI 29.69 kg/m   Physical Exam CONSTITUTIONAL: Well developed/well nourished HEAD: Normocephalic/atraumatic ENMT: Mucous membranes moist NECK: supple no meningeal signs CV: S1/S2 noted, no murmurs/rubs/gallops noted LUNGS: Lungs are clear to auscultation bilaterally, no apparent distress ABDOMEN: soft, mild suprapubic tenderness, no rebound or guarding,  bowel sounds noted throughout abdomen GU:no cva tenderness Rectal-chaperoned with nurse Dedra present Small amount of soft stool noted, no blood or melena.  No rectal mass or abscess.  No impaction  no significant prostate enlargement NEURO: Pt is awake/alert/appropriate, moves all extremitiesx4.  No facial droop.   EXTREMITIES: pulses normal/equal, full ROM SKIN: warm, color normal PSYCH: no abnormalities of mood noted, alert and oriented to situation  (all labs ordered are listed, but only abnormal results are displayed) Labs Reviewed  URINALYSIS, ROUTINE W REFLEX MICROSCOPIC    EKG: None  Radiology: No results found.   Procedures   Medications Ordered in the ED - No data to display  Clinical Course as of 04/25/24 0654  Wed Apr 25, 2024  9346 Patient presents for urinary retention over the past 12 hours.  He reports having this previously around 5 years ago He is already on Flomax. Foley catheter was placed without difficulty with significant urine output Patient feels much improved in no acute distress. [DW]  [phone] Patient was concerned about his recent constipation.  He thought he might be impacted. Rectal exam not reveal significant impaction.  He has already been taking over-the-counter medicines for his constipation.  No signs of bowel obstruction [DW]  9346 Patient feels comfortable managing catheter at home, and he will follow-up closely with urology early next week [DW]    Clinical Course User Index [DW] Midge Golas, MD  Medical Decision Making Amount and/or Complexity of Data Reviewed Labs: ordered.        Final diagnoses:  Urinary retention    ED Discharge Orders     None          Midge Golas, MD 04/25/24 [phone]

## 2024-04-25 NOTE — ED Triage Notes (Signed)
 Patient reports urinary retention , last voided 12 hours ago with bladder pressure .

## 2024-04-25 NOTE — ED Notes (Signed)
 Leg bag for Foley catheter attached and working successfully.

## 2024-05-01 DIAGNOSIS — R338 Other retention of urine: Secondary | ICD-10-CM | POA: Diagnosis not present

## 2024-05-09 DIAGNOSIS — G4733 Obstructive sleep apnea (adult) (pediatric): Secondary | ICD-10-CM | POA: Diagnosis not present
